# Patient Record
Sex: Male | Born: 1950
Health system: Southern US, Community
[De-identification: ages and names within clinical notes are randomized; demographics above are authoritative.]

## PROBLEM LIST (undated history)

## (undated) DIAGNOSIS — M1990 Unspecified osteoarthritis, unspecified site: Secondary | ICD-10-CM

## (undated) DIAGNOSIS — T7840XA Allergy, unspecified, initial encounter: Secondary | ICD-10-CM

## (undated) DIAGNOSIS — H269 Unspecified cataract: Secondary | ICD-10-CM

## (undated) DIAGNOSIS — K219 Gastro-esophageal reflux disease without esophagitis: Secondary | ICD-10-CM

## (undated) HISTORY — DX: Unspecified osteoarthritis, unspecified site: M19.90

## (undated) HISTORY — PX: COLONOSCOPY: SHX174

## (undated) HISTORY — DX: Unspecified cataract: H26.9

## (undated) HISTORY — PX: NERVE TRANSFER: SHX2084

## (undated) HISTORY — DX: Allergy, unspecified, initial encounter: T78.40XA

## (undated) HISTORY — PX: EYE SURGERY: SHX253

## (undated) HISTORY — PX: POLYPECTOMY: SHX149

## (undated) HISTORY — DX: Gastro-esophageal reflux disease without esophagitis: K21.9

---

## 2004-01-29 ENCOUNTER — Emergency Department (HOSPITAL_COMMUNITY): Admission: EM | Admit: 2004-01-29 | Discharge: 2004-01-29 | Payer: Self-pay | Admitting: Emergency Medicine

## 2004-04-14 ENCOUNTER — Ambulatory Visit: Payer: Self-pay | Admitting: Internal Medicine

## 2004-05-11 ENCOUNTER — Ambulatory Visit (HOSPITAL_BASED_OUTPATIENT_CLINIC_OR_DEPARTMENT_OTHER): Admission: RE | Admit: 2004-05-11 | Discharge: 2004-05-11 | Payer: Self-pay | Admitting: Orthopedic Surgery

## 2006-02-09 ENCOUNTER — Ambulatory Visit: Payer: Self-pay | Admitting: Internal Medicine

## 2006-02-09 LAB — CONVERTED CEMR LAB
ALT: 21 units/L (ref 0–40)
Albumin: 4.1 g/dL (ref 3.5–5.2)
Alkaline Phosphatase: 54 units/L (ref 39–117)
Basophils Relative: 0.6 % (ref 0.0–1.0)
CO2: 30 meq/L (ref 19–32)
Eosinophil percent: 4.2 % (ref 0.0–5.0)
Glucose, Bld: 93 mg/dL (ref 70–99)
HCT: 48.7 % (ref 39.0–52.0)
Hemoglobin: 16.9 g/dL (ref 13.0–17.0)
Hgb A1c MFr Bld: 4.8 % (ref 4.6–6.0)
MCHC: 34.8 g/dL (ref 30.0–36.0)
MCV: 92.9 fL (ref 78.0–100.0)
Monocytes Absolute: 0.6 10*3/uL (ref 0.2–0.7)
Neutro Abs: 4.4 10*3/uL (ref 1.4–7.7)
Neutrophils Relative %: 63.3 % (ref 43.0–77.0)
Sodium: 141 meq/L (ref 135–145)
Total Bilirubin: 1 mg/dL (ref 0.3–1.2)
Total Protein: 6.9 g/dL (ref 6.0–8.3)
WBC: 6.9 10*3/uL (ref 4.5–10.5)

## 2014-07-15 ENCOUNTER — Encounter: Payer: Self-pay | Admitting: Family

## 2014-07-15 ENCOUNTER — Other Ambulatory Visit (INDEPENDENT_AMBULATORY_CARE_PROVIDER_SITE_OTHER): Payer: 59

## 2014-07-15 ENCOUNTER — Ambulatory Visit (INDEPENDENT_AMBULATORY_CARE_PROVIDER_SITE_OTHER): Payer: 59 | Admitting: Family

## 2014-07-15 ENCOUNTER — Telehealth: Payer: Self-pay | Admitting: Family

## 2014-07-15 VITALS — BP 134/86 | HR 70 | Temp 97.7°F | Resp 16 | Ht 68.0 in | Wt 193.0 lb

## 2014-07-15 DIAGNOSIS — Z23 Encounter for immunization: Secondary | ICD-10-CM

## 2014-07-15 DIAGNOSIS — Z0000 Encounter for general adult medical examination without abnormal findings: Secondary | ICD-10-CM | POA: Diagnosis not present

## 2014-07-15 LAB — BASIC METABOLIC PANEL
BUN: 16 mg/dL (ref 6–23)
CHLORIDE: 110 meq/L (ref 96–112)
CO2: 28 meq/L (ref 19–32)
CREATININE: 0.83 mg/dL (ref 0.40–1.50)
Calcium: 9.5 mg/dL (ref 8.4–10.5)
GFR: 99.76 mL/min (ref >60.00)
Glucose, Bld: 94 mg/dL (ref 70–99)
Potassium: 4.7 mEq/L (ref 3.5–5.1)
Sodium: 141 mEq/L (ref 135–145)

## 2014-07-15 LAB — LIPID PANEL
CHOL/HDL RATIO: 3
CHOLESTEROL: 165 mg/dL (ref 0–200)
HDL: 48.2 mg/dL (ref >39.00)
LDL Cholesterol: 107 mg/dL — ABNORMAL HIGH (ref 0–99)
NonHDL: 116.8
TRIGLYCERIDES: 47 mg/dL (ref 0.0–149.0)
VLDL: 9.4 mg/dL (ref 0.0–40.0)

## 2014-07-15 LAB — CBC
HCT: 48.1 % (ref 39.0–52.0)
HEMOGLOBIN: 17 g/dL (ref 13.0–17.0)
MCHC: 35.3 g/dL (ref 30.0–36.0)
MCV: 93.1 fl (ref 78.0–100.0)
PLATELETS: 283 10*3/uL (ref 150.0–400.0)
RBC: 5.17 Mil/uL (ref 4.22–5.81)
RDW: 12.9 % (ref 11.5–15.5)
WBC: 6.7 10*3/uL (ref 4.0–10.5)

## 2014-07-15 LAB — HEMOGLOBIN A1C: Hgb A1c MFr Bld: 5 % (ref 4.6–6.5)

## 2014-07-15 LAB — TSH: TSH: 2.55 u[IU]/mL (ref 0.35–4.50)

## 2014-07-15 NOTE — Telephone Encounter (Signed)
Updated pharmacy.../lmb 

## 2014-07-15 NOTE — Assessment & Plan Note (Addendum)
1) Anticipatory Guidance: Discussed importance of wearing a seatbelt while driving and not texting while driving; changing batteries in smoke detector at least once annually; wearing suntan lotion when outside; eating a balanced and moderate diet; getting physical activity at least 30 minutes per day.  2) Immunizations / Screenings / Labs:  TDAP shot given today. Prescription for Zostavax given. Declined flu shot. All other immunizations are up to date per recommendations. Patient is due for a dental exam which he will schedule and colonoscopy referral has been placed. Obtain CBC, BMET, Lipid profile and A1c, TSH, Hep C.  Overall well exam. Patients BMI indicates that he is overweight. Recommend weight loss of 5-10% of his body weight through increased nutrient density and increased physical activity with goal of [address]00 steps. Otherwise continue healthy lifestyle behaviors. Follow up prevention exam in 1 year. Follow up office visit pending lab work.

## 2014-07-15 NOTE — Progress Notes (Signed)
Subjective:    Patient ID: Sergio Nelson, male    DOB: December 01, 1950, 64 y.o.   MRN: [id number]  Chief Complaint  Patient presents with  . Establish Care  . Annual Exam    HPI:  Sergio Nelson is a 64 y.o. male who presents today for an annual wellness visit.   1) Health Maintenance -   Diet - Averages 2-3 meals per day; Balance between fruits, vegetables, lean meats, whole grains; 3-4 cups of caffeine per day  Exercise - Not a lot; walks at work. Indicates he walks about 6000-7000 steps per day.   2) Preventative Exams / Immunizations:  Dental -- Due for an exam  Vision -- Up to date   Health Maintenance  Topic Date Due  . TETANUS/TDAP  06/26/1971  . COLONOSCOPY  06/26/2002  . ZOSTAVAX  06/25/2012  . INFLUENZA VACCINE  01/18/2015 (Originally 11/15/2013)  . HIV Screening  07/15/2015 (Originally 06/26/1967)      There is no immunization history on file for this patient.  No Known Allergies  No current outpatient prescriptions on file prior to visit.   No current facility-administered medications on file prior to visit.    Past Medical History  Diagnosis Date  . GERD (gastroesophageal reflux disease)   . Allergy     Past Surgical History  Procedure Laterality Date  . Nerve transfer      on left elbow    Family History  Problem Relation Age of Onset  . Hypertension Mother   . Diabetes Father   . Diabetes Paternal Aunt   . Diabetes Paternal Uncle   . Cancer Maternal Grandmother   . Diabetes Paternal Grandmother   . Cancer Paternal Grandmother   . Diabetes Paternal Grandfather     History   Social History  . Marital Status: Married    Spouse Name: N/A  . Number of Children: 2  . Years of Education: 16   Occupational History  . Engineer    Social History Main Topics  . Smoking status: Never Smoker   . Smokeless tobacco: Never Used  . Alcohol Use: 0.0 oz/week    0 Standard drinks or equivalent per week     Comment: occasional  . Drug Use: No    . Sexual Activity: Not on file   Other Topics Concern  . Not on file   Social History Narrative   Fun: Travel, work on cars, go to [employer], and play golf.    Denies any religious beliefs that effect health care.      Review of Systems  Constitutional: Denies fever, chills, fatigue, or significant weight gain/loss. HENT: Head: Denies headache or neck pain Ears: Denies changes in hearing, ringing in ears, earache, drainage Nose: Denies discharge, stuffiness, itching, nosebleed, sinus pain Allergies that are worse in the morning. Has had it for several years and has tried Zyrtec. Indicates that the symptoms wax and wane. Throat: Denies sore throat, hoarseness, dry mouth, sores, thrush Eyes: Denies loss/changes in vision, pain, redness, blurry/double vision, flashing lights Cardiovascular: Denies chest pain/discomfort, tightness, palpitations, shortness of breath with activity, difficulty lying down, swelling, sudden awakening with shortness of breath Respiratory: Denies shortness of breath, cough, sputum production, wheezing Gastrointestinal: Denies dysphasia, heartburn, change in appetite, nausea, change in bowel habits, rectal bleeding, constipation, diarrhea, yellow skin or eyes Genitourinary: Denies frequency, urgency, burning/pain, blood in urine, incontinence, change in urinary strength. Musculoskeletal: Denies muscle/joint pain, stiffness, back pain, redness or swelling of joints, trauma Skin:  Denies rashes, lumps, itching, dryness, color changes, or hair/nail changes Neurological: Denies dizziness, fainting, seizures, weakness, numbness, tingling, tremor Psychiatric - Denies nervousness, stress, depression or memory loss Endocrine: Denies heat or cold intolerance, sweating, frequent urination, excessive thirst, changes in appetite Hematologic: Denies ease of bruising or bleeding     Objective:     BP 134/86 mmHg  Pulse 70  Temp(Src) 97.7 F (36.5 C) (Oral)  Resp 16   Ht 5\' 8"  (1.727 m)  Wt 193 lb (87.544 kg)  BMI 29.35 kg/m2  SpO2 95% Nursing note and vital signs reviewed.  Physical Exam  Constitutional: He is oriented to person, place, and time. He appears well-developed and well-nourished.  HENT:  Head: Normocephalic.  Right Ear: Hearing, tympanic membrane, external ear and ear canal normal.  Left Ear: Hearing, tympanic membrane, external ear and ear canal normal.  Nose: Nose normal.  Mouth/Throat: Uvula is midline, oropharynx is clear and moist and mucous membranes are normal.  Turbinates noted to be slightly pale.   Eyes: Conjunctivae and EOM are normal. Pupils are equal, round, and reactive to light.  Neck: Neck supple. No JVD present. No tracheal deviation present. No thyromegaly present.  Cardiovascular: Normal rate, regular rhythm, normal heart sounds and intact distal pulses.   Pulmonary/Chest: Effort normal and breath sounds normal.  Abdominal: Soft. Bowel sounds are normal. He exhibits no distension and no mass. There is no tenderness. There is no rebound and no guarding.  Musculoskeletal: Normal range of motion. He exhibits no edema or tenderness.  Lymphadenopathy:    He has no cervical adenopathy.  Neurological: He is alert and oriented to person, place, and time. He has normal reflexes. No cranial nerve deficit. He exhibits normal muscle tone. Coordination normal.  Skin: Skin is warm and dry.  Psychiatric: He has a normal mood and affect. His behavior is normal. Judgment and thought content normal.       Assessment & Plan:

## 2014-07-15 NOTE — Patient Instructions (Signed)
Thank you for choosing [facility].  Summary/Instructions:  Health Maintenance A healthy lifestyle and preventative care can promote health and wellness.  Maintain regular health, dental, and eye exams.  Eat a healthy diet. Foods like vegetables, fruits, whole grains, low-fat dairy products, and lean protein foods contain the nutrients you need and are low in calories. Decrease your intake of foods high in solid fats, added sugars, and salt. Get information about a proper diet from your health care provider, if necessary.  Regular physical exercise is one of the most important things you can do for your health. Most adults should get at least 150 minutes of moderate-intensity exercise (any activity that increases your heart rate and causes you to sweat) each week. In addition, most adults need muscle-strengthening exercises on 2 or more days a week.   Maintain a healthy weight. The body mass index (BMI) is a screening tool to identify possible weight problems. It provides an estimate of body fat based on height and weight. Your health care provider can find your BMI and can help you achieve or maintain a healthy weight. For males 20 years and older:  A BMI below 18.5 is considered underweight.  A BMI of 18.5 to 24.9 is normal.  A BMI of 25 to 29.9 is considered overweight.  A BMI of 30 and above is considered obese.  Maintain normal blood lipids and cholesterol by exercising and minimizing your intake of saturated fat. Eat a balanced diet with plenty of fruits and vegetables. Blood tests for lipids and cholesterol should begin at age 13 and be repeated every 5 years. If your lipid or cholesterol levels are high, you are over age 24, or you are at high risk for heart disease, you may need your cholesterol levels checked more frequently.Ongoing high lipid and cholesterol levels should be treated with medicines if diet and exercise are not working.  If you smoke, find out from your  health care provider how to quit. If you do not use tobacco, do not start.  Lung cancer screening is recommended for adults aged 43-80 years who are at high risk for developing lung cancer because of a history of smoking. A yearly low-dose CT scan of the lungs is recommended for people who have at least a 30-pack-year history of smoking and are current smokers or have quit within the past 15 years. A pack year of smoking is smoking an average of 1 pack of cigarettes a day for 1 year (for example, a 30-pack-year history of smoking could mean smoking 1 pack a day for 30 years or 2 packs a day for 15 years). Yearly screening should continue until the smoker has stopped smoking for at least 15 years. Yearly screening should be stopped for people who develop a health problem that would prevent them from having lung cancer treatment.  If you choose to drink alcohol, do not have more than 2 drinks per day. One drink is considered to be 12 oz (360 mL) of beer, 5 oz (150 mL) of wine, or 1.5 oz (45 mL) of liquor.  Avoid the use of street drugs. Do not share needles with anyone. Ask for help if you need support or instructions about stopping the use of drugs.  High blood pressure causes heart disease and increases the risk of stroke. Blood pressure should be checked at least every 1-2 years. Ongoing high blood pressure should be treated with medicines if weight loss and exercise are not effective.  If you are  107-57 years old, ask your health care provider if you should take aspirin to prevent heart disease.  Diabetes screening involves taking a blood sample to check your fasting blood sugar level. This should be done once every 3 years after age 25 if you are at a normal weight and without risk factors for diabetes. Testing should be considered at a younger age or be carried out more frequently if you are overweight and have at least 1 risk factor for diabetes.  Colorectal cancer can be detected and often  prevented. Most routine colorectal cancer screening begins at the age of 97 and continues through age 42. However, your health care provider may recommend screening at an earlier age if you have risk factors for colon cancer. On a yearly basis, your health care provider may provide home test kits to check for hidden blood in the stool. A small camera at the end of a tube may be used to directly examine the colon (sigmoidoscopy or colonoscopy) to detect the earliest forms of colorectal cancer. Talk to your health care provider about this at age 62 when routine screening begins. A direct exam of the colon should be repeated every 5-10 years through age 40, unless early forms of precancerous polyps or small growths are found.  People who are at an increased risk for hepatitis B should be screened for this virus. You are considered at high risk for hepatitis B if:  You were born in a country where hepatitis B occurs often. Talk with your health care provider about which countries are considered high risk.  Your parents were born in a high-risk country and you have not received a shot to protect against hepatitis B (hepatitis B vaccine).  You have HIV or AIDS.  You use needles to inject street drugs.  You live with, or have sex with, someone who has hepatitis B.  You are a man who has sex with other men (MSM).  You get hemodialysis treatment.  You take certain medicines for conditions like cancer, organ transplantation, and autoimmune conditions.  Hepatitis C blood testing is recommended for all people born from [birth year] through 1963 and any individual with known risk factors for hepatitis C.  Healthy men should no longer receive prostate-specific antigen (PSA) blood tests as part of routine cancer screening. Talk to your health care provider about prostate cancer screening.  Testicular cancer screening is not recommended for adolescents or adult males who have no symptoms. Screening includes  self-exam, a health care provider exam, and other screening tests. Consult with your health care provider about any symptoms you have or any concerns you have about testicular cancer.  Practice safe sex. Use condoms and avoid high-risk sexual practices to reduce the spread of sexually transmitted infections (STIs).  You should be screened for STIs, including gonorrhea and chlamydia if:  You are sexually active and are younger than 24 years.  You are older than 24 years, and your health care provider tells you that you are at risk for this type of infection.  Your sexual activity has changed since you were last screened, and you are at an increased risk for chlamydia or gonorrhea. Ask your health care provider if you are at risk.  If you are at risk of being infected with HIV, it is recommended that you take a prescription medicine daily to prevent HIV infection. This is called pre-exposure prophylaxis (PrEP). You are considered at risk if:  You are a man who has sex with other  men (MSM).  You are a heterosexual man who is sexually active with multiple partners.  You take drugs by injection.  You are sexually active with a partner who has HIV.  Talk with your health care provider about whether you are at high risk of being infected with HIV. If you choose to begin PrEP, you should first be tested for HIV. You should then be tested every 3 months for as long as you are taking PrEP.  Use sunscreen. Apply sunscreen liberally and repeatedly throughout the day. You should seek shade when your shadow is shorter than you. Protect yourself by wearing long sleeves, pants, a wide-brimmed hat, and sunglasses year round whenever you are outdoors.  Tell your health care provider of new moles or changes in moles, especially if there is a change in shape or color. Also, tell your health care provider if a mole is larger than the size of a pencil eraser.  A one-time screening for abdominal aortic aneurysm  (AAA) and surgical repair of large AAAs by ultrasound is recommended for men aged 40-75 years who are current or former smokers.  Stay current with your vaccines (immunizations). Document Released: 09/30/2007 Document Revised: 04/08/2013 Document Reviewed: 08/29/2010 [facility] Patient Information 2015 [place], [place]. This information is not intended to replace advice given to you by your health care provider. Make sure you discuss any questions you have with your health care provider.

## 2014-07-15 NOTE — Progress Notes (Deleted)
   Subjective:    Patient ID: Sergio Nelson, male    DOB: [birth year], 64 y.o.   MRN: [id number]  No chief complaint on file.   HPI:  Sergio Nelson is a 64 y.o. male who presents today    Review of Systems    Objective:    BP 134/86 mmHg  Pulse 70  Temp(Src) 97.7 F (36.5 C) (Oral)  Resp 16  Ht 5\' 8"  (1.727 m)  Wt 193 lb (87.544 kg)  BMI 29.35 kg/m2  SpO2 95% Nursing note and vital signs reviewed.  Physical Exam  Constitutional: He is oriented to person, place, and time. He appears well-developed and well-nourished. No distress.  Cardiovascular: Normal rate, regular rhythm, normal heart sounds and intact distal pulses.   Pulmonary/Chest: Effort normal and breath sounds normal.  Neurological: He is alert and oriented to person, place, and time.  Skin: Skin is warm and dry.  Psychiatric: He has a normal mood and affect. His behavior is normal. Judgment and thought content normal.       Assessment & Plan:

## 2014-07-15 NOTE — Telephone Encounter (Signed)
Pt stopped back by front desk after his appt with Marya Amsler and wants to have his pharmacy location updated to [facility] [address].

## 2014-07-15 NOTE — Progress Notes (Signed)
Pre visit review using our clinic review tool, if applicable. No additional management support is needed unless otherwise documented below in the visit note. 

## 2014-07-18 ENCOUNTER — Telehealth: Payer: Self-pay | Admitting: Family

## 2014-07-18 NOTE — Telephone Encounter (Signed)
Please inform the patient that his kidney function, electrolytes, white/red blood cells, thyroid, and cholesterol are all normal. His A1c was 5.0 which is also normal indicating no diabetes or pre-diabetes. We await his hepatitis C test results at this time.

## 2014-07-20 LAB — HEPATITIS C RNA QUANTITATIVE: HCV QUANT: NOT DETECTED [IU]/mL (ref <15)

## 2014-07-20 NOTE — Telephone Encounter (Signed)
LVM for pt to call back.

## 2014-07-22 NOTE — Telephone Encounter (Signed)
Pt aware of results 

## 2014-07-22 NOTE — Telephone Encounter (Signed)
LVM for pt to call back.

## 2015-04-14 ENCOUNTER — Ambulatory Visit: Payer: 59 | Admitting: Family

## 2016-10-12 ENCOUNTER — Encounter: Payer: Self-pay | Admitting: Internal Medicine

## 2016-10-12 ENCOUNTER — Ambulatory Visit (INDEPENDENT_AMBULATORY_CARE_PROVIDER_SITE_OTHER): Payer: 59 | Admitting: Internal Medicine

## 2016-10-12 ENCOUNTER — Other Ambulatory Visit (INDEPENDENT_AMBULATORY_CARE_PROVIDER_SITE_OTHER): Payer: 59

## 2016-10-12 ENCOUNTER — Ambulatory Visit (INDEPENDENT_AMBULATORY_CARE_PROVIDER_SITE_OTHER)
Admission: RE | Admit: 2016-10-12 | Discharge: 2016-10-12 | Disposition: A | Discharge status: Home / Self Care | Payer: 59 | Source: Ambulatory Visit | Attending: Internal Medicine | Admitting: Internal Medicine

## 2016-10-12 VITALS — BP 150/82 | HR 77 | Ht 68.0 in | Wt 195.0 lb

## 2016-10-12 DIAGNOSIS — R030 Elevated blood-pressure reading, without diagnosis of hypertension: Secondary | ICD-10-CM

## 2016-10-12 DIAGNOSIS — E785 Hyperlipidemia, unspecified: Secondary | ICD-10-CM

## 2016-10-12 DIAGNOSIS — R079 Chest pain, unspecified: Secondary | ICD-10-CM | POA: Diagnosis not present

## 2016-10-12 DIAGNOSIS — R1011 Right upper quadrant pain: Secondary | ICD-10-CM

## 2016-10-12 DIAGNOSIS — R972 Elevated prostate specific antigen [PSA]: Secondary | ICD-10-CM

## 2016-10-12 LAB — LIPID PANEL
CHOLESTEROL: 168 mg/dL (ref 0–200)
HDL: 55.2 mg/dL (ref >39.00)
LDL Cholesterol: 104 mg/dL — ABNORMAL HIGH (ref 0–99)
NONHDL: 112.51
Total CHOL/HDL Ratio: 3
Triglycerides: 44 mg/dL (ref 0.0–149.0)
VLDL: 8.8 mg/dL (ref 0.0–40.0)

## 2016-10-12 LAB — URINALYSIS, ROUTINE W REFLEX MICROSCOPIC
Bilirubin Urine: NEGATIVE
KETONES UR: NEGATIVE
LEUKOCYTES UA: NEGATIVE
Nitrite: NEGATIVE
PH: 6 (ref 5.0–8.0)
Total Protein, Urine: NEGATIVE
UROBILINOGEN UA: 0.2 (ref 0.0–1.0)
Urine Glucose: NEGATIVE

## 2016-10-12 LAB — CBC WITH DIFFERENTIAL/PLATELET
BASOS ABS: 0 10*3/uL (ref 0.0–0.1)
Basophils Relative: 0.3 % (ref 0.0–3.0)
Eosinophils Absolute: 0 10*3/uL (ref 0.0–0.7)
Eosinophils Relative: 0.2 % (ref 0.0–5.0)
HCT: 48 % (ref 39.0–52.0)
Hemoglobin: 16.7 g/dL (ref 13.0–17.0)
LYMPHS ABS: 1.9 10*3/uL (ref 0.7–4.0)
Lymphocytes Relative: 13.8 % (ref 12.0–46.0)
MCHC: 34.9 g/dL (ref 30.0–36.0)
MCV: 93.8 fl (ref 78.0–100.0)
MONO ABS: 0.9 10*3/uL (ref 0.1–1.0)
Monocytes Relative: 6.6 % (ref 3.0–12.0)
NEUTROS ABS: 10.7 10*3/uL — AB (ref 1.4–7.7)
NEUTROS PCT: 79.1 % — AB (ref 43.0–77.0)
PLATELETS: 304 10*3/uL (ref 150.0–400.0)
RBC: 5.12 Mil/uL (ref 4.22–5.81)
RDW: 13.2 % (ref 11.5–15.5)
WBC: 13.5 10*3/uL — ABNORMAL HIGH (ref 4.0–10.5)

## 2016-10-12 LAB — HEPATIC FUNCTION PANEL
ALBUMIN: 4.2 g/dL (ref 3.5–5.2)
ALK PHOS: 47 U/L (ref 39–117)
ALT: 15 U/L (ref 0–53)
AST: 17 U/L (ref 0–37)
BILIRUBIN DIRECT: 0.2 mg/dL (ref 0.0–0.3)
TOTAL PROTEIN: 6.4 g/dL (ref 6.0–8.3)
Total Bilirubin: 0.9 mg/dL (ref 0.2–1.2)

## 2016-10-12 LAB — BASIC METABOLIC PANEL
BUN: 9 mg/dL (ref 6–23)
CHLORIDE: 109 meq/L (ref 96–112)
CO2: 26 meq/L (ref 19–32)
Calcium: 9.7 mg/dL (ref 8.4–10.5)
Creatinine, Ser: 0.77 mg/dL (ref 0.40–1.50)
GFR: 108 mL/min (ref >60.00)
GLUCOSE: 141 mg/dL — AB (ref 70–99)
POTASSIUM: 4.5 meq/L (ref 3.5–5.1)
Sodium: 143 mEq/L (ref 135–145)

## 2016-10-12 LAB — TSH: TSH: 2.03 u[IU]/mL (ref 0.35–4.50)

## 2016-10-12 LAB — PSA: PSA: 0.73 ng/mL (ref 0.10–4.00)

## 2016-10-12 MED ORDER — ASPIRIN 81 MG PO TABS
81.0000 mg | ORAL_TABLET | Freq: Every day | ORAL | 11 refills | Status: DC
Start: 2016-10-12 — End: 2017-10-22

## 2016-10-12 NOTE — Assessment & Plan Note (Signed)
Mild, for f/u lab per pt request, lower chol diet

## 2016-10-12 NOTE — Progress Notes (Signed)
Subjective:    Patient ID: Sergio Nelson, male    DOB: 1950-10-03, 66 y.o.   MRN: [id number]  HPI  Here with acute visit as PCP not available; c/o pain mostly to RUQ but points to radiating around both lower rib cage at the costal margins;  Has hx of reflux but no help with antacid; tylenol helped more but now worn off.  Pain is mild to mod, non exertional , non pleuritic, but was worse with change in position at night  did keep him up at night.  Had lower appetite, ? Clammy and nausea.  No sob, heart racing or dizziness.   BP at home usually < 140/90 Pt denies other chest pain, increased sob or doe, wheezing, orthopnea, PND, increased LE swelling, palpitations, dizziness or syncope.  Wife with him is insistent on "full panel" of labs though I am not PCP. BP Readings from Last 3 Encounters:  10/12/16 (!) 150/82  07/15/14 134/86  Retired in Feb 2108, lost 5 lbs but does not think too abnormal.  No fever, cough, wheezing.  No hx of liver/GB or PUD.  Denies worsening reflux, other abd pain, dysphagia,  bowel change or blood, though has mild intermittent constipation better with prunes for several years.   Past Medical History:  Diagnosis Date  . Allergy   . GERD (gastroesophageal reflux disease)    Past Surgical History:  Procedure Laterality Date  . NERVE TRANSFER     on left elbow    reports that he has never smoked. He has never used smokeless tobacco. He reports that he drinks alcohol. He reports that he does not use drugs. family history includes Cancer in his maternal grandmother and paternal grandmother; Diabetes in his father, paternal aunt, paternal grandfather, paternal grandmother, and paternal uncle; Hypertension in his mother. No Known Allergies No current outpatient prescriptions on file prior to visit.   No current facility-administered medications on file prior to visit.    Review of Systems  Constitutional: Negative for other unusual diaphoresis or sweats HENT: Negative for  ear discharge or swelling Eyes: Negative for other worsening visual disturbances Respiratory: Negative for stridor or other swelling  Gastrointestinal: Negative for worsening distension or other blood Genitourinary: Negative for retention or other urinary change Musculoskeletal: Negative for other MSK pain or swelling Skin: Negative for color change or other new lesions Neurological: Negative for worsening tremors and other numbness  Psychiatric/Behavioral: Negative for worsening agitation or other fatigue All other system neg per pt    Objective:   Physical Exam BP (!) 150/82   Pulse 77   Ht 5\' 8"  (1.727 m)   Wt 195 lb (88.5 kg)   SpO2 100%   BMI 29.65 kg/m  VS noted,  Constitutional: Pt appears in NAD HENT: Head: NCAT.  Right Ear: External ear normal.  Left Ear: External ear normal.  Eyes: . Pupils are equal, round, and reactive to light. Conjunctivae and EOM are normal Nose: without d/c or deformity Neck: Neck supple. Gross normal ROM Cardiovascular: Normal rate and regular rhythm.   Pulmonary/Chest: Effort normal and breath sounds without rales or wheezing.  Abd:  Soft, NT, ND, + BS, no organomegaly except for bilat costal margin tenderness without swelling or rash Neurological: Pt is alert. At baseline orientation, motor grossly intact Skin: Skin is warm. No rashes, other new lesions, no LE edema Psychiatric: Pt behavior is normal without agitation  No other exam findings  Lab Results  Component Value Date   WBC  6.7 07/15/2014   HGB 17.0 07/15/2014   HCT 48.1 07/15/2014   PLT 283.0 07/15/2014   GLUCOSE 94 07/15/2014   CHOL 165 07/15/2014   TRIG 47.0 07/15/2014   HDL 48.20 07/15/2014   LDLCALC 107 (H) 07/15/2014   ALT 21 02/09/2006   AST 23 02/09/2006   NA 141 07/15/2014   K 4.7 07/15/2014   CL 110 07/15/2014   CREATININE 0.83 07/15/2014   BUN 16 07/15/2014   CO2 28 07/15/2014   TSH 2.55 07/15/2014   PSA 0.54 02/09/2006   HGBA1C 5.0 07/15/2014   ECG  today I have personally interpreted NSR  Hemoglobin A1c  Order: [id number]  Status:  Final result  Visible to patient:  No (Not Released)  Dx:  Hyperlipoproteinemia    Ref Range & Units 15:02 21yr ago   Hgb A1c MFr Bld 4.6 - 6.5 % 4.9  5.0CM            Assessment & Plan:

## 2016-10-12 NOTE — Patient Instructions (Addendum)
Your EKG was OK today  OK to take tylenol further for pain  Please start Aspirin 81 mg - 1 per day (enteric coated only) - OTC  Please continue all other medications as before, and refills have been done if requested.  Please have the pharmacy call with any other refills you may need.  Please continue your efforts at being more active, low cholesterol diet, and weight control.  Please keep your appointments with your specialists as you may have planned  You will be contacted regarding the referral for: Stress Test  Please go to the XRAY Department in the Basement (go straight as you get off the elevator) for the x-ray testing  Please go to the LAB in the Basement (turn left off the elevator) for the tests to be done today  You will be contacted by phone if any changes need to be made immediately.  Otherwise, you will receive a letter about your results with an explanation, but please check with MyChart first.  Please remember to sign up for MyChart if you have not done so, as this will be important to you in the future with finding out test results, communicating by private email, and scheduling acute appointments online when needed.

## 2016-10-13 ENCOUNTER — Encounter: Payer: Self-pay | Admitting: Internal Medicine

## 2016-10-13 ENCOUNTER — Other Ambulatory Visit (INDEPENDENT_AMBULATORY_CARE_PROVIDER_SITE_OTHER): Payer: 59

## 2016-10-13 DIAGNOSIS — E785 Hyperlipidemia, unspecified: Secondary | ICD-10-CM

## 2016-10-13 DIAGNOSIS — R079 Chest pain, unspecified: Secondary | ICD-10-CM

## 2016-10-13 LAB — HEMOGLOBIN A1C: HEMOGLOBIN A1C: 4.9 % (ref 4.6–6.5)

## 2016-10-15 ENCOUNTER — Emergency Department (HOSPITAL_COMMUNITY): Payer: 59

## 2016-10-15 ENCOUNTER — Observation Stay (HOSPITAL_COMMUNITY)
Admission: EM | Admit: 2016-10-15 | Discharge: 2016-10-18 | Disposition: A | Discharge status: Home / Self Care | Payer: 59 | Attending: General Surgery | Admitting: General Surgery

## 2016-10-15 ENCOUNTER — Encounter (HOSPITAL_COMMUNITY): Payer: Self-pay | Admitting: Emergency Medicine

## 2016-10-15 DIAGNOSIS — K219 Gastro-esophageal reflux disease without esophagitis: Secondary | ICD-10-CM | POA: Diagnosis not present

## 2016-10-15 DIAGNOSIS — K8000 Calculus of gallbladder with acute cholecystitis without obstruction: Secondary | ICD-10-CM | POA: Diagnosis not present

## 2016-10-15 DIAGNOSIS — R109 Unspecified abdominal pain: Secondary | ICD-10-CM | POA: Diagnosis present

## 2016-10-15 DIAGNOSIS — R030 Elevated blood-pressure reading, without diagnosis of hypertension: Secondary | ICD-10-CM | POA: Insufficient documentation

## 2016-10-15 DIAGNOSIS — R1011 Right upper quadrant pain: Secondary | ICD-10-CM

## 2016-10-15 DIAGNOSIS — K819 Cholecystitis, unspecified: Secondary | ICD-10-CM | POA: Diagnosis present

## 2016-10-15 DIAGNOSIS — Z7982 Long term (current) use of aspirin: Secondary | ICD-10-CM | POA: Insufficient documentation

## 2016-10-15 DIAGNOSIS — K810 Acute cholecystitis: Secondary | ICD-10-CM

## 2016-10-15 DIAGNOSIS — Z4659 Encounter for fitting and adjustment of other gastrointestinal appliance and device: Secondary | ICD-10-CM

## 2016-10-15 DIAGNOSIS — Z419 Encounter for procedure for purposes other than remedying health state, unspecified: Secondary | ICD-10-CM

## 2016-10-15 DIAGNOSIS — K56609 Unspecified intestinal obstruction, unspecified as to partial versus complete obstruction: Secondary | ICD-10-CM

## 2016-10-15 LAB — LIPASE, BLOOD: LIPASE: 20 U/L (ref 11–51)

## 2016-10-15 LAB — CBC
HEMATOCRIT: 45.5 % (ref 39.0–52.0)
Hemoglobin: 16.4 g/dL (ref 13.0–17.0)
MCH: 33.4 pg (ref 26.0–34.0)
MCHC: 36 g/dL (ref 30.0–36.0)
MCV: 92.7 fL (ref 78.0–100.0)
Platelets: 287 10*3/uL (ref 150–400)
RBC: 4.91 MIL/uL (ref 4.22–5.81)
RDW: 12.9 % (ref 11.5–15.5)
WBC: 14.5 10*3/uL — AB (ref 4.0–10.5)

## 2016-10-15 LAB — COMPREHENSIVE METABOLIC PANEL
ALT: 17 U/L (ref 17–63)
AST: 22 U/L (ref 15–41)
Albumin: 3.7 g/dL (ref 3.5–5.0)
Alkaline Phosphatase: 49 U/L (ref 38–126)
Anion gap: 10 (ref 5–15)
BUN: 13 mg/dL (ref 6–20)
CHLORIDE: 104 mmol/L (ref 101–111)
CO2: 27 mmol/L (ref 22–32)
Calcium: 8.9 mg/dL (ref 8.9–10.3)
Creatinine, Ser: 0.81 mg/dL (ref 0.61–1.24)
Glucose, Bld: 90 mg/dL (ref 65–99)
POTASSIUM: 3.9 mmol/L (ref 3.5–5.1)
Sodium: 141 mmol/L (ref 135–145)
Total Bilirubin: 1.1 mg/dL (ref 0.3–1.2)
Total Protein: 7.5 g/dL (ref 6.5–8.1)

## 2016-10-15 LAB — URINALYSIS, ROUTINE W REFLEX MICROSCOPIC
BACTERIA UA: NONE SEEN
BILIRUBIN URINE: NEGATIVE
Glucose, UA: NEGATIVE mg/dL
Hgb urine dipstick: NEGATIVE
Ketones, ur: NEGATIVE mg/dL
Nitrite: NEGATIVE
Protein, ur: 30 mg/dL — AB
Specific Gravity, Urine: 1.038 — ABNORMAL HIGH (ref 1.005–1.030)
pH: 5 (ref 5.0–8.0)

## 2016-10-15 MED ORDER — CEFTRIAXONE SODIUM 2 G IJ SOLR
2.0000 g | Freq: Every day | INTRAMUSCULAR | Status: DC
  Administered 2016-10-16 – 2016-10-17 (×3): 2 g via INTRAVENOUS
  Filled 2016-10-15 (×3): qty 2

## 2016-10-15 MED ORDER — KCL IN DEXTROSE-NACL 20-5-0.45 MEQ/L-%-% IV SOLN
INTRAVENOUS | Status: DC
  Administered 2016-10-16: via INTRAVENOUS
  Administered 2016-10-16 – 2016-10-18 (×2): 1000 mL via INTRAVENOUS
  Filled 2016-10-15 (×8): qty 1000

## 2016-10-15 MED ORDER — SODIUM CHLORIDE 0.9 % IV BOLUS (SEPSIS)
500.0000 mL | Freq: Once | INTRAVENOUS | Status: AC
  Administered 2016-10-15: 500 mL via INTRAVENOUS

## 2016-10-15 MED ORDER — MORPHINE SULFATE (PF) 2 MG/ML IV SOLN
4.0000 mg | INTRAVENOUS | Status: DC | PRN
  Administered 2016-10-15: 4 mg via INTRAVENOUS
  Filled 2016-10-15 (×2): qty 2

## 2016-10-15 MED ORDER — PANTOPRAZOLE SODIUM 40 MG IV SOLR
40.0000 mg | Freq: Every day | INTRAVENOUS | Status: DC
  Administered 2016-10-15 – 2016-10-17 (×3): 40 mg via INTRAVENOUS
  Filled 2016-10-15 (×3): qty 40

## 2016-10-15 MED ORDER — HEPARIN SODIUM (PORCINE) 5000 UNIT/ML IJ SOLN
5000.0000 [IU] | Freq: Three times a day (TID) | INTRAMUSCULAR | Status: DC
  Administered 2016-10-15 – 2016-10-16 (×2): 5000 [IU] via SUBCUTANEOUS
  Filled 2016-10-15 (×2): qty 1

## 2016-10-15 MED ORDER — MORPHINE SULFATE (PF) 2 MG/ML IV SOLN
1.0000 mg | INTRAVENOUS | Status: DC | PRN
  Administered 2016-10-15 – 2016-10-16 (×3): 1 mg via INTRAVENOUS
  Filled 2016-10-15 (×2): qty 1

## 2016-10-15 MED ORDER — ONDANSETRON HCL 4 MG/2ML IJ SOLN
4.0000 mg | Freq: Four times a day (QID) | INTRAMUSCULAR | Status: DC | PRN
  Administered 2016-10-16 – 2016-10-17 (×2): 4 mg via INTRAVENOUS
  Filled 2016-10-15 (×2): qty 2

## 2016-10-15 MED ORDER — ONDANSETRON HCL 4 MG/2ML IJ SOLN
4.0000 mg | Freq: Once | INTRAMUSCULAR | Status: AC
  Administered 2016-10-15: 4 mg via INTRAVENOUS
  Filled 2016-10-15: qty 2

## 2016-10-15 MED ORDER — ONDANSETRON 4 MG PO TBDP: 4.0000 mg | ORAL_TABLET | Freq: Four times a day (QID) | ORAL | Status: DC | PRN

## 2016-10-15 MED ORDER — SODIUM CHLORIDE 0.9 % IV SOLN
Freq: Once | INTRAVENOUS | Status: AC
  Administered 2016-10-15: 19:00:00 via INTRAVENOUS

## 2016-10-15 NOTE — H&P (Signed)
Chief Complaint:  Right upper quadrant pain and gallstones  History of Present Illness:  Sergio Nelson is an 66 y.o. male who presented to the ER with a couple of day history of right upper quadrant pain that has been waxing and waning.  Ultrasound tonight showed wall thickening without fluid.  Focal tenderness without rebound.  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    Past Medical History:  Diagnosis Date  . Allergy   . GERD (gastroesophageal reflux disease)     Past Surgical History:  Procedure Laterality Date  . NERVE TRANSFER     on left elbow    No current facility-administered medications for this encounter.    Current Outpatient Prescriptions  Medication Sig Dispense Refill  . acetaminophen (TYLENOL) 325 MG tablet Take 325 mg by mouth every 6 (six) hours as needed for moderate pain.    . calcium carbonate (TUMS - DOSED IN MG ELEMENTAL CALCIUM) 500 MG chewable tablet Chew 1 tablet by mouth 2 (two) times daily as needed for indigestion or heartburn.    . fexofenadine-pseudoephedrine (ALLEGRA-D) 60-120 MG 12 hr tablet Take 1 tablet by mouth 2 (two) times daily as needed (allergies).    . Omega-3 Fatty Acids (FISH OIL) 1000 MG CAPS Take 1 capsule by mouth daily.    . ranitidine (ZANTAC) 150 MG tablet Take 150 mg by mouth 2 (two) times daily as needed for heartburn.    Marland Kitchen aspirin 81 MG tablet Take 1 tablet (81 mg total) by mouth daily. 100 tablet 11   Patient has no known allergies. Family History  Problem Relation Age of Onset  . Hypertension Mother   . Diabetes Father   . Diabetes Paternal Aunt   . Diabetes Paternal Uncle   . Cancer Maternal Grandmother   . Diabetes Paternal Grandmother   . Cancer Paternal Grandmother   . Diabetes Paternal Grandfather    Social History:   reports that he has never smoked. He has never used smokeless tobacco.  He reports that he drinks alcohol. He reports that he does not use drugs.   REVIEW OF SYSTEMS : Negative except for see problem list  Physical Exam:   Blood pressure 131/70, pulse 78, temperature 98.1 F (36.7 C), temperature source Oral, resp. rate 16, height 5' 8"  (1.727 m), weight 88.5 kg (195 lb), SpO2 100 %. Body mass index is 29.65 kg/m.  Gen:  WDWN WM NAD  Neurological: Alert and oriented to person, place, and time. Motor and sensory function is grossly intact  Head: Normocephalic and atraumatic.  Eyes: Conjunctivae are normal. Pupils are equal, round, and reactive to light. No scleral icterus.  Neck: Normal range of motion. Neck supple. No tracheal deviation or thyromegaly present.  Cardiovascular:  SR without murmurs or gallops.  No carotid bruits Breast:  Not examined Respiratory: Effort normal.  No respiratory distress. No chest wall tenderness. Breath sounds normal.  No wheezes, rales or rhonchi.  Abdomen:  Focal tenderness without rebound or guarding GU:  Not examined Musculoskeletal: Normal range of motion. Extremities are nontender. No cyanosis, edema or clubbing noted Lymphadenopathy: No cervical, preauricular, postauricular or axillary adenopathy is present Skin: Skin is warm and dry. No rash noted. No diaphoresis. No erythema. No pallor. Pscyh: Normal mood and affect. Behavior is normal. Judgment and thought content normal.   LABORATORY  RESULTS: Results for orders placed or performed during the hospital encounter of 10/15/16 (from the past 48 hour(s))  Lipase, blood     Status: None   Collection Time: 10/15/16  3:16 PM  Result Value Ref Range   Lipase 20 11 - 51 U/L  Comprehensive metabolic panel     Status: None   Collection Time: 10/15/16  3:16 PM  Result Value Ref Range   Sodium 141 135 - 145 mmol/L   Potassium 3.9 3.5 - 5.1 mmol/L   Chloride 104 101 - 111 mmol/L   CO2 27 22 - 32 mmol/L   Glucose, Bld 90 65 - 99 mg/dL   BUN 13 6 - 20 mg/dL   Creatinine,  Ser 0.81 0.61 - 1.24 mg/dL   Calcium 8.9 8.9 - 10.3 mg/dL   Total Protein 7.5 6.5 - 8.1 g/dL   Albumin 3.7 3.5 - 5.0 g/dL   AST 22 15 - 41 U/L   ALT 17 17 - 63 U/L   Alkaline Phosphatase 49 38 - 126 U/L   Total Bilirubin 1.1 0.3 - 1.2 mg/dL   GFR calc non Af Amer >60 >60 mL/min   GFR calc Af Amer >60 >60 mL/min    Comment: (NOTE) The eGFR has been calculated using the CKD EPI equation. This calculation has not been validated in all clinical situations. eGFR's persistently <60 mL/min signify possible Chronic Kidney Disease.    Anion gap 10 5 - 15  CBC     Status: Abnormal   Collection Time: 10/15/16  3:16 PM  Result Value Ref Range   WBC 14.5 (H) 4.0 - 10.5 K/uL   RBC 4.91 4.22 - 5.81 MIL/uL   Hemoglobin 16.4 13.0 - 17.0 g/dL   HCT 45.5 39.0 - 52.0 %   MCV 92.7 78.0 - 100.0 fL   MCH 33.4 26.0 - 34.0 pg   MCHC 36.0 30.0 - 36.0 g/dL   RDW 12.9 11.5 - 15.5 %   Platelets 287 150 - 400 K/uL  Urinalysis, Routine w reflex microscopic     Status: Abnormal   Collection Time: 10/15/16  3:16 PM  Result Value Ref Range   Color, Urine AMBER (A) YELLOW    Comment: BIOCHEMICALS MAY BE AFFECTED BY COLOR   APPearance HAZY (A) CLEAR   Specific Gravity, Urine 1.038 (H) 1.005 - 1.030   pH 5.0 5.0 - 8.0   Glucose, UA NEGATIVE NEGATIVE mg/dL   Hgb urine dipstick NEGATIVE NEGATIVE   Bilirubin Urine NEGATIVE NEGATIVE   Ketones, ur NEGATIVE NEGATIVE mg/dL   Protein, ur 30 (A) NEGATIVE mg/dL   Nitrite NEGATIVE NEGATIVE   Leukocytes, UA TRACE (A) NEGATIVE   RBC / HPF 0-5 0 - 5 RBC/hpf   WBC, UA 6-30 0 - 5 WBC/hpf   Bacteria, UA NONE SEEN NONE SEEN   Squamous Epithelial / LPF 0-5 (A) NONE SEEN   Mucous PRESENT      RADIOLOGY RESULTS: US Abdomen Limited Ruq  Result Date: 10/15/2016 CLINICAL DATA:  Right upper quadrant pain. EXAM: ULTRASOUND ABDOMEN LIMITED RIGHT UPPER QUADRANT COMPARISON:  None. FINDINGS: Gallbladder: Stones and sludge are seen in the gallbladder with no Murphy's sign. Mild  wall thickening is seen. No pericholecystic fluid. Common bile duct: Diameter: 4.4 mm Liver: No focal lesion identified. Within normal limits in parenchymal echogenicity. IMPRESSION: 1. Stones and sludge are seen in the gallbladder with mild wall thickening but no pericholecystic fluid or Murphy's sign. A HIDA scan may further evaluate if there is  concern for acute cholecystitis. Electronically Signed   By: Dorise Bullion III M.D   On: 10/15/2016 20:21    Problem List: Patient Active Problem List   Diagnosis Date Noted  . Elevated blood pressure reading without diagnosis of hypertension 10/15/2016  . RUQ pain 10/12/2016  . Chest pain 10/12/2016  . Hyperlipidemia 10/12/2016  . Routine general medical examination at a health care facility 07/15/2014    Assessment & Plan: Cholecystitis Admit, antibiotics and lap  chole    Matt B. Hassell Done, MD, [facility] [phone] beeper [phone]  10/15/2016 9:53 PM

## 2016-10-15 NOTE — Assessment & Plan Note (Addendum)
Atypical, cant r/o cardiac, ecg reviewed, for cxr, to start asa 81 qd, for stress test,  to f/u any worsening symptoms or concerns

## 2016-10-15 NOTE — ED Provider Notes (Signed)
[facility] DEPT Provider Note   CSN: [id number] Arrival date & time: 10/15/16  1451     History   Chief Complaint Chief Complaint  Patient presents with  . Abdominal Pain  . Nausea    HPI Sergio Nelson is a 66 y.o. male. Chief complaint is abdominal pain.  HPI:  49 over otherwise very healthy. He reports abdominal pain since Tuesday, 6 days ago. He ate some sausage. Later that night he had some crampy abdominal pain. Probably right upper quadrant. He has had continued episodes of intermittent pain nausea and emesis.  He's been able to eat small amounts of dry cereal and other very bland intake although frequently nauseated. Pain still right upper quadrant radiating to his back.  Had LFTs performed with his primary care physician on Thursday, 3 days ago which were normal.  Does not smoke. No alcohol use. No anti-inflammatory use. No recent steroids. Denies excessive caffeine.  Past surgical history negative  Past Medical History:  Diagnosis Date  . Allergy   . GERD (gastroesophageal reflux disease)     Patient Active Problem List   Diagnosis Date Noted  . Elevated blood pressure reading without diagnosis of hypertension 10/15/2016  . Cholecystitis 10/15/2016  . RUQ pain 10/12/2016  . Chest pain 10/12/2016  . Hyperlipidemia 10/12/2016  . Routine general medical examination at a health care facility 07/15/2014    Past Surgical History:  Procedure Laterality Date  . NERVE TRANSFER     on left elbow       Home Medications    Prior to Admission medications   Medication Sig Start Date End Date Taking? Authorizing Provider  acetaminophen (TYLENOL) 325 MG tablet Take 325 mg by mouth every 6 (six) hours as needed for moderate pain.   Yes [provider]  calcium carbonate (TUMS - DOSED IN MG ELEMENTAL CALCIUM) 500 MG chewable tablet Chew 1 tablet by mouth 2 (two) times daily as needed for indigestion or heartburn.   Yes [provider]    fexofenadine-pseudoephedrine (ALLEGRA-D) 60-120 MG 12 hr tablet Take 1 tablet by mouth 2 (two) times daily as needed (allergies).   Yes [provider]  Omega-3 Fatty Acids (FISH OIL) 1000 MG CAPS Take 1 capsule by mouth daily.   Yes [provider]  ranitidine (ZANTAC) 150 MG tablet Take 150 mg by mouth 2 (two) times daily as needed for heartburn.   Yes [provider]  aspirin 81 MG tablet Take 1 tablet (81 mg total) by mouth daily. 10/12/16   Biagio Borg, MD    Family History Family History  Problem Relation Age of Onset  . Hypertension Mother   . Diabetes Father   . Diabetes Paternal Aunt   . Diabetes Paternal Uncle   . Cancer Maternal Grandmother   . Diabetes Paternal Grandmother   . Cancer Paternal Grandmother   . Diabetes Paternal Grandfather     Social History Social History  Substance Use Topics  . Smoking status: Never Smoker  . Smokeless tobacco: Never Used  . Alcohol use 0.0 oz/week     Comment: occasional     Allergies   Patient has no known allergies.   Review of Systems Review of Systems  Constitutional: Negative for appetite change, chills, diaphoresis, fatigue and fever.  HENT: Negative for mouth sores, sore throat and trouble swallowing.   Eyes: Negative for visual disturbance.  Respiratory: Negative for cough, chest tightness, shortness of breath and wheezing.   Cardiovascular: Negative for chest  pain.  Gastrointestinal: Positive for abdominal pain, nausea and vomiting. Negative for abdominal distention and diarrhea.  Endocrine: Negative for polydipsia, polyphagia and polyuria.  Genitourinary: Negative for dysuria, frequency and hematuria.  Musculoskeletal: Negative for gait problem.  Skin: Negative for color change, pallor and rash.  Neurological: Negative for dizziness, syncope, light-headedness and headaches.  Hematological: Does not bruise/bleed easily.  Psychiatric/Behavioral: Negative for behavioral problems and  confusion.     Physical Exam Updated Vital Signs BP (!) 143/66 (BP Location: Left Arm)   Pulse 73   Temp (!) 101.2 F (38.4 C) (Oral)   Resp 18   Ht 5\' 8"  (1.727 m)   Wt 88.5 kg (195 lb)   SpO2 97%   BMI 29.65 kg/m   Physical Exam  Constitutional: He is oriented to person, place, and time. He appears well-developed and well-nourished. No distress.  HENT:  Head: Normocephalic.  Eyes: Conjunctivae are normal. Pupils are equal, round, and reactive to light. No scleral icterus.  Neck: Normal range of motion. Neck supple. No thyromegaly present.  Cardiovascular: Normal rate and regular rhythm.  Exam reveals no gallop and no friction rub.   No murmur heard. Pulmonary/Chest: Effort normal and breath sounds normal. No respiratory distress. He has no wheezes. He has no rales.  Abdominal: Soft. Bowel sounds are normal. He exhibits no distension. There is tenderness. There is no rebound.  Right upper quadrant subcostal tenderness.  Musculoskeletal: Normal range of motion.  Neurological: He is alert and oriented to person, place, and time.  Skin: Skin is warm and dry. No rash noted.  Psychiatric: He has a normal mood and affect. His behavior is normal.     ED Treatments / Results  Labs (all labs ordered are listed, but only abnormal results are displayed) Labs Reviewed  CBC - Abnormal; Notable for the following:       Result Value   WBC 14.5 (*)    All other components within normal limits  URINALYSIS, ROUTINE W REFLEX MICROSCOPIC - Abnormal; Notable for the following:    Color, Urine AMBER (*)    APPearance HAZY (*)    Specific Gravity, Urine 1.038 (*)    Protein, ur 30 (*)    Leukocytes, UA TRACE (*)    Squamous Epithelial / LPF 0-5 (*)    All other components within normal limits  LIPASE, BLOOD  COMPREHENSIVE METABOLIC PANEL  HIV ANTIBODY (ROUTINE TESTING)  CBC  CREATININE, SERUM    EKG  EKG Interpretation None       Radiology US Abdomen Limited Ruq  Result  Date: 10/15/2016 CLINICAL DATA:  Right upper quadrant pain. EXAM: ULTRASOUND ABDOMEN LIMITED RIGHT UPPER QUADRANT COMPARISON:  None. FINDINGS: Gallbladder: Stones and sludge are seen in the gallbladder with no Murphy's sign. Mild wall thickening is seen. No pericholecystic fluid. Common bile duct: Diameter: 4.4 mm Liver: No focal lesion identified. Within normal limits in parenchymal echogenicity. IMPRESSION: 1. Stones and sludge are seen in the gallbladder with mild wall thickening but no pericholecystic fluid or Murphy's sign. A HIDA scan may further evaluate if there is concern for acute cholecystitis. Electronically Signed   By: Dorise Bullion III M.D   On: 10/15/2016 20:21    Procedures Procedures (including critical care time)  Medications Ordered in ED Medications  heparin injection 5,000 Units (not administered)  dextrose 5 % and 0.45 % NaCl with KCl 20 mEq/L infusion (not administered)  cefTRIAXone (ROCEPHIN) 2 g in dextrose 5 % 50 mL IVPB (not administered)  morphine 2 MG/ML injection 1 mg (not administered)  ondansetron (ZOFRAN-ODT) disintegrating tablet 4 mg (not administered)    Or  ondansetron (ZOFRAN) injection 4 mg (not administered)  pantoprazole (PROTONIX) injection 40 mg (not administered)  morphine 2 MG/ML injection 4 mg (4 mg Intravenous Given 10/15/16 2227)  sodium chloride 0.9 % bolus 500 mL (0 mLs Intravenous Stopped 10/15/16 2157)  0.9 %  sodium chloride infusion ( Intravenous Stopped 10/15/16 2157)  ondansetron (ZOFRAN) injection 4 mg (4 mg Intravenous Given 10/15/16 2227)     Initial Impression / Assessment and Plan / ED Course  I have reviewed the triage vital signs and the nursing notes.  Pertinent labs & imaging results that were available during my care of the patient were reviewed by me and considered in my medical decision making (see chart for details).    Leukocytosis. LFTs normal. Ultrasound shows stones sludge and thickened wall.  I discussed the case with  Dr. Hassell Done. As always, he returned my call immediately. He presents to the emergency room, has examined the patient and interviewed him. He is planning admission for antibiotics and am cholecystectomy.  Final Clinical Impressions(s) / ED Diagnoses   Final diagnoses:  RUQ pain  Cholecystitis, acute    New Prescriptions Current Discharge Medication List       Tanna Furry, MD 10/15/16 2334

## 2016-10-15 NOTE — Assessment & Plan Note (Signed)
?   Reactive, no hx of essential HTN, declines further tx at this time, to f/u BP at home and next visit

## 2016-10-15 NOTE — ED Triage Notes (Signed)
Patient c/o right abd pain since Tuesday after eating a spicy dinner but wasn't relieved after taking antacids. Martin Majestic to PCP on WED and MD thought was muscular. Patient reports after leaving MD office he ate milkshake which increased pain and vomited.  Patient states that pain is constant but does increase in severity at times. Patient states that BMs have been normal and last one yesterday. Patient states that pain. Patient does decrease when taking Tylenol. Patient reports intermittent fevers.

## 2016-10-15 NOTE — Assessment & Plan Note (Signed)
Also for labs as documented, for abd u/s if abnormal lft's

## 2016-10-16 LAB — SURGICAL PCR SCREEN
MRSA, PCR: NEGATIVE
Staphylococcus aureus: POSITIVE — AB

## 2016-10-16 LAB — HIV ANTIBODY (ROUTINE TESTING W REFLEX): HIV SCREEN 4TH GENERATION: NONREACTIVE

## 2016-10-16 MED ORDER — CHLORHEXIDINE GLUCONATE CLOTH 2 % EX PADS
6.0000 | MEDICATED_PAD | Freq: Once | CUTANEOUS | Status: AC
  Administered 2016-10-17: 6 via TOPICAL

## 2016-10-16 MED ORDER — CHLORHEXIDINE GLUCONATE CLOTH 2 % EX PADS
6.0000 | MEDICATED_PAD | Freq: Once | CUTANEOUS | Status: AC
  Administered 2016-10-16: 6 via TOPICAL

## 2016-10-16 MED ORDER — HEPARIN SODIUM (PORCINE) 5000 UNIT/ML IJ SOLN
5000.0000 [IU] | Freq: Three times a day (TID) | INTRAMUSCULAR | Status: AC
  Administered 2016-10-16 (×2): 5000 [IU] via SUBCUTANEOUS
  Filled 2016-10-16: qty 1

## 2016-10-16 NOTE — Progress Notes (Signed)
Patient ID: Sergio Nelson, male   DOB: September 17, 1950, 66 y.o.   MRN: [id number]  [facility] Progress Note     Subjective: CC- cholecystitis Lying in bed comfortably. States that pain is currently well controlled with medication. Denies n/v.  Planning to go to [place] on Friday to visit family.  Objective: Vital signs in last 24 hours: Temp:  [98.1 F (36.7 C)-101.2 F (38.4 C)] 98.6 F (37 C) (07/02 0428) Pulse Rate:  [71-78] 71 (07/02 0428) Resp:  [16-18] 16 (07/02 0428) BP: (100-156)/(51-86) 100/51 (07/02 0428) SpO2:  [96 %-100 %] 98 % (07/02 0428) Weight:  [88.5 kg (195 lb)] 88.5 kg (195 lb) (07/01 1514)    Intake/Output from previous day: 07/01 0701 - 07/02 0700 In: 2131.7 [I.V.:1581.7; IV Piggyback:550] Out: 150 [Urine:150] Intake/Output this shift: No intake/output data recorded.  PE: Gen:  Alert, NAD, pleasant HEENT: EOM's intact, pupils equal  Card:  RRR, no M/G/R heard Pulm:  CTAB, no W/R/R, effort normal Abd: Soft, ND, mild RUQ tenderness, +BS, no HSM Ext:  No erythema, edema, or tenderness BUE/BLE  Psych: A&Ox3  Skin: no rashes noted, warm and dry  Lab Results:   Recent Labs  10/15/16 1516  WBC 14.5*  HGB 16.4  HCT 45.5  PLT 287   BMET  Recent Labs  10/15/16 1516  NA 141  K 3.9  CL 104  CO2 27  GLUCOSE 90  BUN 13  CREATININE 0.81  CALCIUM 8.9   PT/INR No results for input(s): LABPROT, INR in the last 72 hours. CMP     Component Value Date/Time   NA 141 10/15/2016 1516   K 3.9 10/15/2016 1516   CL 104 10/15/2016 1516   CO2 27 10/15/2016 1516   GLUCOSE 90 10/15/2016 1516   GLUCOSE 93 02/09/2006 1054   BUN 13 10/15/2016 1516   CREATININE 0.81 10/15/2016 1516   CALCIUM 8.9 10/15/2016 1516   PROT 7.5 10/15/2016 1516   ALBUMIN 3.7 10/15/2016 1516   AST 22 10/15/2016 1516   ALT 17 10/15/2016 1516   ALKPHOS 49 10/15/2016 1516   BILITOT 1.1 10/15/2016 1516   GFRNONAA >60 10/15/2016 1516   GFRAA >60 10/15/2016 1516   Lipase      Component Value Date/Time   LIPASE 20 10/15/2016 1516       Studies/Results: US Abdomen Limited Ruq  Result Date: 10/15/2016 CLINICAL DATA:  Right upper quadrant pain. EXAM: ULTRASOUND ABDOMEN LIMITED RIGHT UPPER QUADRANT COMPARISON:  None. FINDINGS: Gallbladder: Stones and sludge are seen in the gallbladder with no Murphy's sign. Mild wall thickening is seen. No pericholecystic fluid. Common bile duct: Diameter: 4.4 mm Liver: No focal lesion identified. Within normal limits in parenchymal echogenicity. IMPRESSION: 1. Stones and sludge are seen in the gallbladder with mild wall thickening but no pericholecystic fluid or Murphy's sign. A HIDA scan may further evaluate if there is concern for acute cholecystitis. Electronically Signed   By: Dorise Bullion III M.D   On: 10/15/2016 20:21    Anti-infectives: Anti-infectives    Start     Dose/Rate Route Frequency Ordered Stop   10/15/16 2345  cefTRIAXone (ROCEPHIN) 2 g in dextrose 5 % 50 mL IVPB     2 g 100 mL/hr over 30 Minutes Intravenous Daily at bedtime 10/15/16 2328         Assessment/Plan GERD  Acute cholecystitis - u/s shows stones and sludge are seen in the gallbladder with mild wall thickening but no pericholecystic fluid or Murphy's sign -  LFTs and lipase WNL - WBC 14.5, TMAX 101.2  ID - rocephin 7/1>> FEN - IVF, NPO VTE - SCDs, hold heparin for possible procedure  Plan - Lap chole with possible IOC tomorrow. [place] for clear liquids today, NPO after midnight. Hold heparin after midnight. Labs in AM.   LOS: 0 days    Jerrye Beavers , [facility] 10/16/2016, 9:25 AM Pager: [phone] Consults: [phone] Mon-Fri 7:00 am-4:30 pm Sat-Sun 7:00 am-11:30 am

## 2016-10-17 ENCOUNTER — Encounter (HOSPITAL_COMMUNITY)
Admission: EM | Disposition: A | Discharge status: Home / Self Care | Payer: Self-pay | Source: Home / Self Care | Attending: Emergency Medicine

## 2016-10-17 ENCOUNTER — Encounter (HOSPITAL_COMMUNITY): Payer: Self-pay | Admitting: Certified Registered Nurse Anesthetist

## 2016-10-17 ENCOUNTER — Observation Stay (HOSPITAL_COMMUNITY): Payer: 59

## 2016-10-17 ENCOUNTER — Observation Stay (HOSPITAL_COMMUNITY): Payer: 59 | Admitting: Certified Registered Nurse Anesthetist

## 2016-10-17 HISTORY — PX: CHOLECYSTECTOMY: SHX55

## 2016-10-17 LAB — CBC
HCT: 38.5 % — ABNORMAL LOW (ref 39.0–52.0)
Hemoglobin: 13.8 g/dL (ref 13.0–17.0)
MCH: 32.6 pg (ref 26.0–34.0)
MCHC: 35.8 g/dL (ref 30.0–36.0)
MCV: 91 fL (ref 78.0–100.0)
PLATELETS: 230 10*3/uL (ref 150–400)
RBC: 4.23 MIL/uL (ref 4.22–5.81)
RDW: 12.6 % (ref 11.5–15.5)
WBC: 7.4 10*3/uL (ref 4.0–10.5)

## 2016-10-17 LAB — COMPREHENSIVE METABOLIC PANEL
ALBUMIN: 2.8 g/dL — AB (ref 3.5–5.0)
ALT: 90 U/L — ABNORMAL HIGH (ref 17–63)
AST: 69 U/L — AB (ref 15–41)
Alkaline Phosphatase: 106 U/L (ref 38–126)
Anion gap: 6 (ref 5–15)
BUN: 6 mg/dL (ref 6–20)
CHLORIDE: 107 mmol/L (ref 101–111)
CO2: 27 mmol/L (ref 22–32)
Calcium: 8.2 mg/dL — ABNORMAL LOW (ref 8.9–10.3)
Creatinine, Ser: 0.74 mg/dL (ref 0.61–1.24)
GFR calc Af Amer: >60 mL/min (ref >60)
Glucose, Bld: 107 mg/dL — ABNORMAL HIGH (ref 65–99)
POTASSIUM: 3.8 mmol/L (ref 3.5–5.1)
SODIUM: 140 mmol/L (ref 135–145)
Total Bilirubin: 4.6 mg/dL — ABNORMAL HIGH (ref 0.3–1.2)
Total Protein: 5.7 g/dL — ABNORMAL LOW (ref 6.5–8.1)

## 2016-10-17 LAB — LIPASE, BLOOD: LIPASE: 21 U/L (ref 11–51)

## 2016-10-17 SURGERY — LAPAROSCOPIC CHOLECYSTECTOMY WITH INTRAOPERATIVE CHOLANGIOGRAM
Anesthesia: General | Site: Abdomen

## 2016-10-17 MED ORDER — SUGAMMADEX SODIUM 200 MG/2ML IV SOLN
INTRAVENOUS | Status: AC
Start: 2016-10-17 — End: 2016-10-17
  Filled 2016-10-17: qty 2

## 2016-10-17 MED ORDER — LACTATED RINGERS IR SOLN
Status: DC | PRN
  Administered 2016-10-17: 1000 mL

## 2016-10-17 MED ORDER — MORPHINE SULFATE (PF) 2 MG/ML IV SOLN
1.0000 mg | INTRAVENOUS | Status: DC | PRN
  Administered 2016-10-17: 1 mg via INTRAVENOUS
  Administered 2016-10-17: 2 mg via INTRAVENOUS
  Filled 2016-10-17 (×2): qty 1

## 2016-10-17 MED ORDER — DEXAMETHASONE SODIUM PHOSPHATE 4 MG/ML IJ SOLN
INTRAMUSCULAR | Status: DC | PRN
  Administered 2016-10-17: 10 mg via INTRAVENOUS

## 2016-10-17 MED ORDER — ONDANSETRON HCL 4 MG/2ML IJ SOLN
INTRAMUSCULAR | Status: DC | PRN
  Administered 2016-10-17: 4 mg via INTRAVENOUS

## 2016-10-17 MED ORDER — ROCURONIUM BROMIDE 50 MG/5ML IV SOSY
PREFILLED_SYRINGE | INTRAVENOUS | Status: DC | PRN
  Administered 2016-10-17: 50 mg via INTRAVENOUS
  Administered 2016-10-17: 10 mg via INTRAVENOUS

## 2016-10-17 MED ORDER — BUPIVACAINE-EPINEPHRINE (PF) 0.25% -1:200000 IJ SOLN
INTRAMUSCULAR | Status: AC
  Filled 2016-10-17: qty 30

## 2016-10-17 MED ORDER — HYDROCODONE-ACETAMINOPHEN 5-325 MG PO TABS: 1.0000 | ORAL_TABLET | ORAL | Status: DC | PRN

## 2016-10-17 MED ORDER — DEXAMETHASONE SODIUM PHOSPHATE 10 MG/ML IJ SOLN
INTRAMUSCULAR | Status: AC
  Filled 2016-10-17: qty 1

## 2016-10-17 MED ORDER — IOPAMIDOL (ISOVUE-300) INJECTION 61%
INTRAVENOUS | Status: DC | PRN
  Administered 2016-10-17: 50 mL via INTRAVENOUS

## 2016-10-17 MED ORDER — LACTATED RINGERS IV SOLN
INTRAVENOUS | Status: DC
  Administered 2016-10-17: 1000 mL via INTRAVENOUS

## 2016-10-17 MED ORDER — MEPERIDINE HCL 50 MG/ML IJ SOLN: 6.2500 mg | INTRAMUSCULAR | Status: DC | PRN

## 2016-10-17 MED ORDER — FENTANYL CITRATE (PF) 100 MCG/2ML IJ SOLN
INTRAMUSCULAR | Status: DC | PRN
  Administered 2016-10-17 (×5): 50 ug via INTRAVENOUS

## 2016-10-17 MED ORDER — ONDANSETRON HCL 4 MG/2ML IJ SOLN: 4.0000 mg | Freq: Once | INTRAMUSCULAR | Status: DC | PRN

## 2016-10-17 MED ORDER — ONDANSETRON HCL 4 MG/2ML IJ SOLN
INTRAMUSCULAR | Status: AC
  Filled 2016-10-17: qty 2

## 2016-10-17 MED ORDER — 0.9 % SODIUM CHLORIDE (POUR BTL) OPTIME
TOPICAL | Status: DC | PRN
  Administered 2016-10-17: 1000 mL

## 2016-10-17 MED ORDER — ROCURONIUM BROMIDE 50 MG/5ML IV SOSY
PREFILLED_SYRINGE | INTRAVENOUS | Status: AC
  Filled 2016-10-17: qty 5

## 2016-10-17 MED ORDER — IOPAMIDOL (ISOVUE-300) INJECTION 61%
INTRAVENOUS | Status: AC
  Filled 2016-10-17: qty 50

## 2016-10-17 MED ORDER — FENTANYL CITRATE (PF) 250 MCG/5ML IJ SOLN
INTRAMUSCULAR | Status: AC
  Filled 2016-10-17: qty 5

## 2016-10-17 MED ORDER — PROPOFOL 10 MG/ML IV BOLUS
INTRAVENOUS | Status: DC | PRN
  Administered 2016-10-17: 160 mg via INTRAVENOUS

## 2016-10-17 MED ORDER — HYDROMORPHONE HCL 2 MG/ML IJ SOLN: 0.2500 mg | INTRAMUSCULAR | Status: DC | PRN

## 2016-10-17 MED ORDER — MIDAZOLAM HCL 2 MG/2ML IJ SOLN
INTRAMUSCULAR | Status: AC
  Filled 2016-10-17: qty 2

## 2016-10-17 MED ORDER — BUPIVACAINE-EPINEPHRINE 0.25% -1:200000 IJ SOLN
INTRAMUSCULAR | Status: DC | PRN
  Administered 2016-10-17: 21 mL

## 2016-10-17 MED ORDER — SUGAMMADEX SODIUM 200 MG/2ML IV SOLN
INTRAVENOUS | Status: DC | PRN
  Administered 2016-10-17: 180 mg via INTRAVENOUS

## 2016-10-17 MED ORDER — LIDOCAINE 2% (20 MG/ML) 5 ML SYRINGE
INTRAMUSCULAR | Status: DC | PRN
  Administered 2016-10-17: 50 mg via INTRAVENOUS

## 2016-10-17 MED ORDER — LIDOCAINE 2% (20 MG/ML) 5 ML SYRINGE
INTRAMUSCULAR | Status: AC
Start: 2016-10-17 — End: 2016-10-17
  Filled 2016-10-17: qty 5

## 2016-10-17 MED ORDER — PROPOFOL 10 MG/ML IV BOLUS
INTRAVENOUS | Status: AC
  Filled 2016-10-17: qty 20

## 2016-10-17 MED ORDER — MIDAZOLAM HCL 5 MG/5ML IJ SOLN
INTRAMUSCULAR | Status: DC | PRN
  Administered 2016-10-17 (×2): 1 mg via INTRAVENOUS

## 2016-10-17 SURGICAL SUPPLY — 33 items
ADH SKN CLS APL DERMABOND .7 (GAUZE/BANDAGES/DRESSINGS) ×1
APPLIER CLIP 5 13 M/L LIGAMAX5 (MISCELLANEOUS) ×3
APR CLP MED LRG 5 ANG JAW (MISCELLANEOUS) ×1
BAG SPEC RTRVL LRG 6X4 10 (ENDOMECHANICALS) ×1
CABLE HIGH FREQUENCY MONO STRZ (ELECTRODE) ×3 IMPLANT
CATH REDDICK CHOLANGI 4FR 50CM (CATHETERS) ×3 IMPLANT
CHLORAPREP W/TINT 26ML (MISCELLANEOUS) ×3 IMPLANT
CLIP APPLIE 5 13 M/L LIGAMAX5 (MISCELLANEOUS) ×1 IMPLANT
COVER MAYO STAND STRL (DRAPES) ×3 IMPLANT
DECANTER SPIKE VIAL GLASS SM (MISCELLANEOUS) ×1 IMPLANT
DERMABOND ADVANCED (GAUZE/BANDAGES/DRESSINGS) ×2
DERMABOND ADVANCED .7 DNX12 (GAUZE/BANDAGES/DRESSINGS) ×1 IMPLANT
DRAPE C-ARM 42X120 X-RAY (DRAPES) ×3 IMPLANT
ELECT REM PT RETURN 15FT ADLT (MISCELLANEOUS) ×3 IMPLANT
GLOVE BIO SURGEON STRL SZ7.5 (GLOVE) ×3 IMPLANT
GOWN STRL REUS W/TWL XL LVL3 (GOWN DISPOSABLE) ×9 IMPLANT
HEMOSTAT SURGICEL 4X8 (HEMOSTASIS) IMPLANT
IRRIG SUCT STRYKERFLOW 2 WTIP (MISCELLANEOUS)
IRRIGATION SUCT STRKRFLW 2 WTP (MISCELLANEOUS) ×1 IMPLANT
IV CATH 14GX2 1/4 (CATHETERS) ×3 IMPLANT
KIT BASIN OR (CUSTOM PROCEDURE TRAY) ×3 IMPLANT
POUCH SPECIMEN RETRIEVAL 10MM (ENDOMECHANICALS) ×3 IMPLANT
SCISSORS LAP 5X35 DISP (ENDOMECHANICALS) ×3 IMPLANT
SET IRRIG TUBING LAPAROSCOPIC (IRRIGATION / IRRIGATOR) ×2 IMPLANT
SLEEVE XCEL OPT CAN 5 100 (ENDOMECHANICALS) ×6 IMPLANT
SUT MNCRL AB 4-0 PS2 18 (SUTURE) ×3 IMPLANT
TOWEL OR 17X26 10 PK STRL BLUE (TOWEL DISPOSABLE) ×3 IMPLANT
TOWEL OR NON WOVEN STRL DISP B (DISPOSABLE) ×3 IMPLANT
TRAY LAPAROSCOPIC (CUSTOM PROCEDURE TRAY) ×3 IMPLANT
TROCAR BLADELESS OPT 5 100 (ENDOMECHANICALS) ×3 IMPLANT
TROCAR XCEL BLUNT TIP 100MML (ENDOMECHANICALS) ×3 IMPLANT
TROCAR XCEL NON-BLD 11X100MML (ENDOMECHANICALS) ×2 IMPLANT
TUBING INSUF HEATED (TUBING) ×3 IMPLANT

## 2016-10-17 NOTE — Transfer of Care (Signed)
Immediate Anesthesia Transfer of Care Note  Patient: Sergio Nelson  Procedure(s) Performed: Procedure(s): LAPAROSCOPIC CHOLECYSTECTOMY WITH  INTRAOPERATIVE CHOLANGIOGRAM (N/A)  Patient Location: PACU  Anesthesia Type:General  Level of Consciousness: Patient easily awoken, sedated, comfortable, cooperative, following commands, responds to stimulation.   Airway & Oxygen Therapy: Patient spontaneously breathing, ventilating well, oxygen via simple oxygen mask.  Post-op Assessment: Report given to PACU RN, vital signs reviewed and stable, moving all extremities.   Post vital signs: Reviewed and stable.  Complications: No apparent anesthesia complications  Last Vitals:  Vitals:   10/16/16 2121 10/17/16 0442  BP: (!) 121/57 (!) 106/58  Pulse: 66 67  Resp: 18 18  Temp: 37.2 C 37.2 C    Last Pain:  Vitals:   10/17/16 0821  TempSrc:   PainSc: 1       Patients Stated Pain Goal: 4 (13/88/71 9597)  Complications: No apparent anesthesia complications

## 2016-10-17 NOTE — Anesthesia Procedure Notes (Signed)
Procedure Name: Intubation Date/Time: 10/17/2016 9:30 AM Performed by: Deliah Boston Pre-anesthesia Checklist: Patient identified, Emergency Drugs available, Suction available and Patient being monitored Patient Re-evaluated:Patient Re-evaluated prior to inductionOxygen Delivery Method: Circle system utilized Preoxygenation: Pre-oxygenation with 100% oxygen Intubation Type: IV induction Ventilation: Mask ventilation without difficulty Grade View: Grade I Tube type: Oral Tube size: 7.5 mm Number of attempts: 1 Airway Equipment and Method: Stylet and Oral airway Placement Confirmation: ETT inserted through vocal cords under direct vision,  positive ETCO2 and breath sounds checked- equal and bilateral Secured at: 22 cm Tube secured with: Tape Dental Injury: Teeth and Oropharynx as per pre-operative assessment  Comments: Intubation by EMT student. EMT student supervised 100%

## 2016-10-17 NOTE — Anesthesia Postprocedure Evaluation (Signed)
Anesthesia Post Note  Patient: Sergio Nelson  Procedure(s) Performed: Procedure(s) (LRB): LAPAROSCOPIC CHOLECYSTECTOMY WITH  INTRAOPERATIVE CHOLANGIOGRAM (N/A)     Patient location during evaluation: PACU Anesthesia Type: General Level of consciousness: awake and alert Pain management: pain level controlled Vital Signs Assessment: post-procedure vital signs reviewed and stable Respiratory status: spontaneous breathing, nonlabored ventilation, respiratory function stable and patient connected to nasal cannula oxygen Cardiovascular status: blood pressure returned to baseline and stable Postop Assessment: no signs of nausea or vomiting Anesthetic complications: no    Last Vitals:  Vitals:   10/17/16 1344 10/17/16 1448  BP: 138/67 (!) 146/82  Pulse: 70 73  Resp: 16 15  Temp: 36.6 C 36.5 C    Last Pain:  Vitals:   10/17/16 1448  TempSrc: Axillary  PainSc: 5                  [name]

## 2016-10-17 NOTE — Anesthesia Preprocedure Evaluation (Signed)
Anesthesia Evaluation  Patient identified by MRN, date of birth, ID band Patient awake    Reviewed: Allergy & Precautions, NPO status , Patient's Chart, lab work & pertinent test results  Airway Mallampati: I  TM Distance: >3 FB Neck ROM: Full    Dental   Pulmonary    Pulmonary exam normal        Cardiovascular Normal cardiovascular exam     Neuro/Psych    GI/Hepatic GERD  Medicated and Controlled,  Endo/Other    Renal/GU      Musculoskeletal   Abdominal   Peds  Hematology   Anesthesia Other Findings   Reproductive/Obstetrics                             Anesthesia Physical Anesthesia Plan  ASA: II  Anesthesia Plan: General   Post-op Pain Management:    Induction: Intravenous  PONV Risk Score and Plan: 2 and Ondansetron, Dexamethasone and Treatment may vary due to age or medical condition  Airway Management Planned: Oral ETT  Additional Equipment:   Intra-op Plan:   Post-operative Plan: Extubation in OR  Informed Consent: I have reviewed the patients History and Physical, chart, labs and discussed the procedure including the risks, benefits and alternatives for the proposed anesthesia with the patient or authorized representative who has indicated his/her understanding and acceptance.     Plan Discussed with: CRNA and Surgeon  Anesthesia Plan Comments:         Anesthesia Quick Evaluation

## 2016-10-17 NOTE — Op Note (Signed)
10/15/2016 - 10/17/2016  11:00 AM  PATIENT:  Sergio Nelson  66 y.o. male  PRE-OPERATIVE DIAGNOSIS:  ACUTE CHOLECYSTITIS with cholelithiasis  POST-OPERATIVE DIAGNOSIS:  ACUTE CHOLECYSTITIS with cholelithiasis  PROCEDURE:  Procedure(s): LAPAROSCOPIC CHOLECYSTECTOMY WITH  INTRAOPERATIVE CHOLANGIOGRAM (N/A)  SURGEON:  Surgeon(s) and Role:    Jovita Kussmaul, MD - Primary  PHYSICIAN ASSISTANT:   ASSISTANTS: none   ANESTHESIA:   local and general  EBL:  Total I/O In: -  Out: 175 [Urine:175]  BLOOD ADMINISTERED:none  DRAINS: none   LOCAL MEDICATIONS USED:  MARCAINE     SPECIMEN:  Source of Specimen:  gallbladder  DISPOSITION OF SPECIMEN:  PATHOLOGY  COUNTS:  YES  TOURNIQUET:  * No tourniquets in log *  DICTATION: .Dragon Dictation   Procedure: After informed consent was obtained the patient was brought to the operating room and placed in the supine position on the operating room table. After adequate induction of general anesthesia the patient's abdomen was prepped with ChloraPrep allowed to dry and draped in usual sterile manner. An appropriate timeout was performed. The area below the umbilicus was infiltrated with quarter percent  Marcaine. A small incision was made with a 15 blade knife. The incision was carried down through the subcutaneous tissue bluntly with a hemostat and [employer] retractors. The linea alba was identified. The linea alba was incised with a 15 blade knife and each side was grasped with Coker clamps. The preperitoneal space was then probed with a hemostat until the peritoneum was opened and access was gained to the abdominal cavity. A 0 Vicryl pursestring stitch was placed in the fascia surrounding the opening. A Hassan cannula was then placed through the opening and anchored in place with the previously placed Vicryl purse string stitch. The abdomen was insufflated with carbon dioxide without difficulty. A laparoscope was inserted through the [facility] cannula in  the right upper quadrant was inspected. Next the epigastric region was infiltrated with % Marcaine. A small incision was made with a 15 blade knife. A 5 mm port was placed bluntly through this incision into the abdominal cavity under direct vision. Next 2 sites were chosen laterally on the right side of the abdomen for placement of 5 mm ports. Each of these areas was infiltrated with quarter percent Marcaine. Small stab incisions were made with a 15 blade knife. 5 mm ports were then placed bluntly through these incisions into the abdominal cavity under direct vision without difficulty. The gallbladder was severely inflamed. A blunt grasper was placed through the lateralmost 5 mm port and used to grasp the dome of the gallbladder and elevated anteriorly and superiorly. Another blunt grasper was placed through the other 5 mm port and used to retract the body and neck of the gallbladder. A dissector was placed through the epigastric port and using the electrocautery the peritoneal reflection at the gallbladder neck was opened. Blunt dissection was then carried out in this area until the gallbladder neck-cystic duct junction was readily identified and a good window was created. A single clip was placed on the gallbladder neck. A small  ductotomy was made just below the clip with laparoscopic scissors. A 14-gauge Angiocath was then placed through the anterior abdominal wall under direct vision. A Reddick cholangiogram catheter was then placed through the Angiocath and flushed. The catheter was then placed in the cystic duct and anchored in place with a clip. A cholangiogram was obtained that showed no filling defects good emptying into the duodenum an adequate length  on the cystic duct. The anchoring clip and catheters were then removed from the patient. 3 clips were placed proximally on the cystic duct and the duct was divided between the 2 sets of clips. Posterior to this the cystic artery was identified and again  dissected bluntly in a circumferential manner until a good window  was created. 2 clips were placed proximally and one distally on the artery and the artery was divided between the 2 sets of clips. Next a laparoscopic hook cautery device was used to separate the gallbladder from the liver bed. Prior to completely detaching the gallbladder from the liver bed the liver bed was inspected and several small bleeding points were coagulated with the electrocautery until the area was completely hemostatic. The gallbladder was then detached the rest of it from the liver bed without difficulty. A laparoscopic bag was inserted through the hassan port. The laparoscope was moved to the epigastric port. The gallbladder was placed within the bag and the bag was sealed.  The bag with the gallbladder was then removed with the [facility] cannula through the infraumbilical port without difficulty. The fascial defect was then closed with the previously placed Vicryl pursestring stitch as well as with another figure-of-eight 0 Vicryl stitch. The liver bed was inspected again and found to be hemostatic. Several stones were spilled and retrieved. The abdomen was irrigated with copious amounts of saline until the effluent was clear. The ports were then removed under direct vision without difficulty and were found to be hemostatic. The gas was allowed to escape. The skin incisions were all closed with interrupted 4-0 Monocryl subcuticular stitches. Dermabond dressings were applied. The patient tolerated the procedure well. At the end of the case all needle sponge and instrument counts were correct. The patient was then awakened and taken to recovery in stable condition  PLAN OF CARE: Admit to inpatient   PATIENT DISPOSITION:  PACU - hemodynamically stable.   Delay start of Pharmacological VTE agent (>24hrs) due to surgical blood loss or risk of bleeding: no

## 2016-10-17 NOTE — Interval H&P Note (Signed)
History and Physical Interval Note:  10/17/2016 9:15 AM  Sergio Nelson  has presented today for surgery, with the diagnosis of ACUTE CHOLECYSTITIS  The various methods of treatment have been discussed with the patient and family. After consideration of risks, benefits and other options for treatment, the patient has consented to  Procedure(s): LAPAROSCOPIC CHOLECYSTECTOMY WITH POSSIBLE INTRAOPERATIVE CHOLANGIOGRAM (N/A) as a surgical intervention .  The patient's history has been reviewed, patient examined, no change in status, stable for surgery.  I have reviewed the patient's chart and labs.  Questions were answered to the patient's satisfaction.     TOTH III,PAUL S

## 2016-10-17 NOTE — Discharge Instructions (Signed)

## 2016-10-18 LAB — HEPATIC FUNCTION PANEL
ALK PHOS: 106 U/L (ref 38–126)
ALT: 113 U/L — AB (ref 17–63)
AST: 72 U/L — AB (ref 15–41)
Albumin: 3.2 g/dL — ABNORMAL LOW (ref 3.5–5.0)
Bilirubin, Direct: 0.8 mg/dL — ABNORMAL HIGH (ref 0.1–0.5)
Indirect Bilirubin: 0.9 mg/dL (ref 0.3–0.9)
TOTAL PROTEIN: 6.5 g/dL (ref 6.5–8.1)
Total Bilirubin: 1.7 mg/dL — ABNORMAL HIGH (ref 0.3–1.2)

## 2016-10-18 MED ORDER — CHLORHEXIDINE GLUCONATE CLOTH 2 % EX PADS
6.0000 | MEDICATED_PAD | Freq: Every day | CUTANEOUS | Status: DC
  Administered 2016-10-18: 6 via TOPICAL

## 2016-10-18 MED ORDER — HYDROCODONE-ACETAMINOPHEN 5-325 MG PO TABS: 1.0000 | ORAL_TABLET | ORAL | 0 refills | Status: DC | PRN

## 2016-10-18 MED ORDER — MUPIROCIN 2 % EX OINT
1.0000 "application " | TOPICAL_OINTMENT | Freq: Two times a day (BID) | CUTANEOUS | Status: DC
  Administered 2016-10-18: 1 via NASAL
  Filled 2016-10-18: qty 22

## 2016-10-18 NOTE — Discharge Summary (Signed)
Physician Discharge Summary  Patient ID: Sergio Nelson MRN: [id number] DOB/AGE: [birth year] 66 y.o.  Admit date: 10/15/2016 Discharge date: 10/18/2016  Admission Diagnoses: Patient Active Problem List   Diagnosis Date Noted  . Elevated blood pressure reading without diagnosis of hypertension 10/15/2016  . Cholecystitis 10/15/2016  . RUQ pain 10/12/2016  . Chest pain 10/12/2016  . Hyperlipidemia 10/12/2016  . Routine general medical examination at a health care facility 07/15/2014     Discharge Diagnoses:  Active Problems:   Cholecystitis   Discharged Condition: stable  Hospital Course:  Pt was admitted to the floor with clinical and imaging findings consistent with acute calculous cholecystitis.  He underwent lap chole with normal cholangiogram 10/17/2016.  He did well overnight without n/v and with good pain control.  He is ambulatory and voiding independently.    He is planning to go to [place] this weekend via car.  I discussed with the patient and spouse that he needs to make sure to get out of car and walk around every 2-3 hours due to increased risk of DVT post op.  I also advised to start stool softeners and/or laxatives at home, especially if he is needing much narcotic or having trouble with BM. We discussed potential symptoms of bile leak and reviewed what to call about and/or go to ED in [place].    His pre op bili was elevated at 4.6  We are repeating today.  As long as improved, he can be discharged to home.    Consults: None  Significant Diagnostic Studies: labs: see epic.  Pre op bili 4.6.  Treatments: surgery: see above.  Discharge Exam: Blood pressure 112/63, pulse 69, temperature 99.1 F (37.3 C), temperature source Oral, resp. rate 16, height 5\' 8"  (1.727 m), weight 88.5 kg (195 lb), SpO2 93 %. General appearance: alert, cooperative and no distress Resp: breathing comfortably GI: soft, non distended, approp tender.  wounds c/d/i. Extremities: extremities normal,  atraumatic, no cyanosis or edema  Disposition: home  Discharge Instructions    Call MD for:  persistant nausea and vomiting    Complete by:  As directed    Call MD for:  redness, tenderness, or signs of infection (pain, swelling, redness, odor or green/yellow discharge around incision site)    Complete by:  As directed    Call MD for:  severe uncontrolled pain    Complete by:  As directed    Call MD for:  temperature >100.4    Complete by:  As directed    Diet - low sodium heart healthy    Complete by:  As directed    Increase activity slowly    Complete by:  As directed    No dressing needed    Complete by:  As directed      Allergies as of 10/18/2016   No Known Allergies     Medication List    TAKE these medications   acetaminophen 325 MG tablet Commonly known as:  TYLENOL Take 325 mg by mouth every 6 (six) hours as needed for moderate pain.   aspirin 81 MG tablet Take 1 tablet (81 mg total) by mouth daily.   calcium carbonate 500 MG chewable tablet Commonly known as:  TUMS - dosed in mg elemental calcium Chew 1 tablet by mouth 2 (two) times daily as needed for indigestion or heartburn.   fexofenadine-pseudoephedrine 60-120 MG 12 hr tablet Commonly known as:  ALLEGRA-D Take 1 tablet by mouth 2 (two) times daily as needed (allergies).  Fish Oil 1000 MG Caps Take 1 capsule by mouth daily.   HYDROcodone-acetaminophen 5-325 MG tablet Commonly known as:  NORCO/VICODIN Take 1-2 tablets by mouth every 4 (four) hours as needed for moderate pain.   ranitidine 150 MG tablet Commonly known as:  ZANTAC Take 150 mg by mouth 2 (two) times daily as needed for heartburn.      Follow-up [facility], [place]. Go on 10/31/2016.   Specialty:  General Surgery Why:  Your appointment is 10/1716 at 10:00AM. Please arrive 30 minutes prior to your appointment to check in and fill out necessary paperwork. Contact information: [address] [place] [phone]          Signed: Stark Klein 10/18/2016, 9:43 AM

## 2016-10-18 NOTE — Progress Notes (Signed)
1 Day Post-Op   Subjective/Chief Complaint: Pain controlled post op.  No n/v.     Objective: Vital signs in last 24 hours: Temp:  [97.7 F (36.5 C)-99.1 F (37.3 C)] 99.1 F (37.3 C) (07/04 0508) Pulse Rate:  [65-75] 69 (07/04 0508) Resp:  [12-18] 16 (07/04 0508) BP: (106-146)/(54-82) 112/63 (07/04 0508) SpO2:  [93 %-99 %] 93 % (07/04 0508) Last BM Date: 10/15/16  Intake/Output from previous day: 07/03 0701 - 07/04 0700 In: 4490 [P.O.:1140; I.V.:3300; IV Piggyback:50] Out: 2285 [Urine:2275; Blood:10] Intake/Output this shift: Total I/O In: 50 [P.O.:50] Out: 175 [Urine:175]  General appearance: alert, cooperative and no distress Resp: breathing comfortably GI: soft, non distended.  wounds d/c/i.    Lab Results:   Recent Labs  10/15/16 1516 10/17/16 0434  WBC 14.5* 7.4  HGB 16.4 13.8  HCT 45.5 38.5*  PLT 287 230   BMET  Recent Labs  10/15/16 1516 10/17/16 0434  NA 141 140  K 3.9 3.8  CL 104 107  CO2 27 27  GLUCOSE 90 107*  BUN 13 6  CREATININE 0.81 0.74  CALCIUM 8.9 8.2*   PT/INR No results for input(s): LABPROT, INR in the last 72 hours. ABG No results for input(s): PHART, HCO3 in the last 72 hours.  Invalid input(s): PCO2, PO2  Studies/Results: Dg Cholangiogram Operative  Result Date: 10/17/2016 CLINICAL DATA:  Intraoperative cholangiogram during laparoscopic cholecystectomy. EXAM: INTRAOPERATIVE CHOLANGIOGRAM FLUOROSCOPY TIME:  10 seconds (2.1 MGy) COMPARISON:  Right upper quadrant abdominal ultrasound - 10/16/2026 FINDINGS: Intraoperative cholangiographic images of the right upper abdominal quadrant during laparoscopic cholecystectomy are provided for review. Surgical clips overlie the expected location of the gallbladder fossa. Contrast injection demonstrates selective cannulation of the central aspect of the cystic duct. There is passage of contrast through the central aspect of the cystic duct with filling of a non dilated common bile duct. There  is passage of contrast though the CBD and into the descending portion of the duodenum. There is minimal reflux of injected contrast into the common hepatic duct and central aspect of the non dilated intrahepatic biliary system. There are no discrete filling defects within the opacified portions of the biliary system to suggest the presence of choledocholithiasis. IMPRESSION: No evidence of choledocholithiasis. Electronically Signed   By: Sandi Mariscal M.D.   On: 10/17/2016 10:39    Anti-infectives: Anti-infectives    Start     Dose/Rate Route Frequency Ordered Stop   10/15/16 2345  cefTRIAXone (ROCEPHIN) 2 g in dextrose 5 % 50 mL IVPB     2 g 100 mL/hr over 30 Minutes Intravenous Daily at bedtime 10/15/16 2328        Assessment/Plan: s/p Procedure(s): LAPAROSCOPIC CHOLECYSTECTOMY WITH  INTRAOPERATIVE CHOLANGIOGRAM (N/A) d/c today if LFTs trending down.   LOS: 0 days    Sergio Nelson 10/18/2016

## 2016-10-18 NOTE — Progress Notes (Signed)
Pt was discharged home today. Instructions were reviewed with patient, prescription was given and questions were answered. Pt was taken to main entrance via wheelchair by NT.

## 2016-11-13 ENCOUNTER — Ambulatory Visit: Payer: 59 | Admitting: Family

## 2016-11-14 ENCOUNTER — Telehealth (HOSPITAL_COMMUNITY): Payer: Self-pay | Admitting: Internal Medicine

## 2016-11-14 NOTE — Telephone Encounter (Signed)
10/16/2016 patient currently in the hosp 7/20 sent msg to Dr. Jenny Reichmann before scheduling/lb 7/20 Dr. Jenny Reichmann ok'd to hold off on stress test/ left msg for pt to call back to inform/lb  Pt will be removed from the workqueue.

## 2017-10-22 ENCOUNTER — Encounter: Payer: Self-pay | Admitting: Family Medicine

## 2017-10-22 DIAGNOSIS — J302 Other seasonal allergic rhinitis: Secondary | ICD-10-CM

## 2017-11-05 DIAGNOSIS — H25013 Cortical age-related cataract, bilateral: Secondary | ICD-10-CM | POA: Diagnosis not present

## 2017-11-05 DIAGNOSIS — H2513 Age-related nuclear cataract, bilateral: Secondary | ICD-10-CM | POA: Diagnosis not present

## 2017-12-03 ENCOUNTER — Ambulatory Visit (INDEPENDENT_AMBULATORY_CARE_PROVIDER_SITE_OTHER): Payer: Medicare Other | Admitting: Family Medicine

## 2017-12-03 ENCOUNTER — Encounter: Payer: Self-pay | Admitting: Family Medicine

## 2017-12-03 VITALS — BP 110/78 | HR 70 | Temp 98.0°F | Resp 14 | Ht 68.0 in | Wt 189.0 lb

## 2017-12-03 DIAGNOSIS — Z13220 Encounter for screening for lipoid disorders: Secondary | ICD-10-CM

## 2017-12-03 DIAGNOSIS — Z1159 Encounter for screening for other viral diseases: Secondary | ICD-10-CM | POA: Diagnosis not present

## 2017-12-03 DIAGNOSIS — Z1211 Encounter for screening for malignant neoplasm of colon: Secondary | ICD-10-CM | POA: Diagnosis not present

## 2017-12-03 DIAGNOSIS — Z125 Encounter for screening for malignant neoplasm of prostate: Secondary | ICD-10-CM | POA: Diagnosis not present

## 2017-12-03 DIAGNOSIS — Z23 Encounter for immunization: Secondary | ICD-10-CM

## 2017-12-03 DIAGNOSIS — E7800 Pure hypercholesterolemia, unspecified: Secondary | ICD-10-CM | POA: Diagnosis not present

## 2017-12-03 DIAGNOSIS — Z7689 Persons encountering health services in other specified circumstances: Secondary | ICD-10-CM | POA: Diagnosis not present

## 2017-12-03 DIAGNOSIS — Z0000 Encounter for general adult medical examination without abnormal findings: Secondary | ICD-10-CM | POA: Diagnosis not present

## 2017-12-03 MED ORDER — ZOSTER VAC RECOMB ADJUVANTED 50 MCG/0.5ML IM SUSR: 0.5000 mL | Freq: Once | INTRAMUSCULAR | 1 refills | Status: AC

## 2017-12-03 NOTE — Progress Notes (Signed)
Subjective:    Patient ID: Sergio Nelson, male    DOB: 1951/03/12, 67 y.o.   MRN: [id number]  HPI  Patient is a very pleasant 67 year old Caucasian male who is here today to establish care.  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  He denies any major concerns today.  He has never had a colonoscopy.  He would like to see GI for this.  He is due for Prevnar 13 this year and Pneumovax 23 next year.  He is due for prostate cancer screening.  It appears he is due for hepatitis C screening.  He is due for a flu shot in September.  He is due for the shingles vaccine, Shingrix.  He is also due for fasting lab work.  Otherwise he is doing well with no concerns. Past Medical History:  Diagnosis Date  . Allergy   . GERD (gastroesophageal reflux disease)    Past Surgical History:  Procedure Laterality Date  . CHOLECYSTECTOMY N/A 10/17/2016   Procedure: LAPAROSCOPIC CHOLECYSTECTOMY WITH  INTRAOPERATIVE CHOLANGIOGRAM;  Surgeon: Jovita Kussmaul, MD;  Location: [facility];  Service: General;  Laterality: N/A;  . NERVE TRANSFER     on left elbow (ulnaar neuropathy)   Current Outpatient Medications on File Prior to Visit  Medication Sig Dispense Refill  . cetirizine (ZYRTEC) 10 MG tablet Take 10 mg by mouth daily.    . Omega-3 Fatty Acids (FISH OIL) 1000 MG CAPS Take 1 capsule by mouth daily.    . fexofenadine-pseudoephedrine (ALLEGRA-D) 60-120 MG 12 hr tablet Take 1 tablet by mouth 2 (two) times daily as needed (allergies).     No current facility-administered medications on file prior to visit.    Allergies  Allergen Reactions  . Bextra [Valdecoxib]    Social History   Socioeconomic History  . Marital status: Married    Spouse name: Not on file  . Number of children: 2  . Years of education: 70  . Highest education level: Not on file  Occupational History  . Occupation: Glass blower/designer  . Financial resource strain: Not on file  . Food insecurity:    Worry: Not  on file    Inability: Not on file  . Transportation needs:    Medical: Not on file    Non-medical: Not on file  Tobacco Use  . Smoking status: Never Smoker  . Smokeless tobacco: Never Used  Substance and Sexual Activity  . Alcohol use: Yes    Alcohol/week: 0.0 standard drinks    Comment: occasional  . Drug use: No  . Sexual activity: Not on file  Lifestyle  . Physical activity:    Days per week: Not on file    Minutes per session: Not on file  . Stress: Not on file  Relationships  . Social connections:    Talks on phone: Not on file    Gets together: Not on file    Attends religious service: Not on file    Active member of club or organization: Not on file    Attends meetings of clubs or organizations: Not on file    Relationship status: Not on file  . Intimate partner violence:    Fear of current or ex partner: Not on file    Emotionally abused: Not on file    Physically abused: Not on file    Forced sexual activity: Not on file  Other Topics Concern  . Not on file  Social  History Narrative   Fun: Travel, work on cars, go to [employer], and play golf.    Denies any religious beliefs that effect health care.    Family History  Problem Relation Age of Onset  . Hypertension Mother   . Ulcers Mother   . Diabetes Father   . Heart disease Father   . Diabetes Paternal Aunt   . Diabetes Paternal Uncle   . Cancer Maternal Grandmother   . Diabetes Paternal Grandmother   . Cancer Paternal Grandmother   . Diabetes Paternal Grandfather   . Cancer Maternal Grandfather      Review of Systems  All other systems reviewed and are negative.      Objective:   Physical Exam  Constitutional: He is oriented to person, place, and time. He appears well-developed and well-nourished. No distress.  HENT:  Head: Normocephalic and atraumatic.  Right Ear: External ear normal.  Left Ear: External ear normal.  Nose: Nose normal.  Mouth/Throat: Oropharynx is clear and moist. No  oropharyngeal exudate.  Eyes: Pupils are equal, round, and reactive to light. Conjunctivae and EOM are normal. Right eye exhibits no discharge. Left eye exhibits no discharge. No scleral icterus.  Neck: Normal range of motion. Neck supple. No JVD present. No tracheal deviation present. No thyromegaly present.  Cardiovascular: Normal rate, regular rhythm, normal heart sounds and intact distal pulses. Exam reveals no gallop and no friction rub.  No murmur heard. Pulmonary/Chest: Effort normal and breath sounds normal. No stridor. No respiratory distress. He has no wheezes. He has no rales. He exhibits no tenderness.  Abdominal: Soft. Bowel sounds are normal. He exhibits no distension and no mass. There is no tenderness. There is no guarding.  Musculoskeletal: Normal range of motion. He exhibits no edema, tenderness or deformity.  Lymphadenopathy:    He has no cervical adenopathy.  Neurological: He is alert and oriented to person, place, and time. He displays normal reflexes. No cranial nerve deficit or sensory deficit. He exhibits normal muscle tone. Coordination normal.  Skin: Skin is warm. No rash noted. He is not diaphoretic. No erythema. No pallor.  Vitals reviewed.         Assessment & Plan:  Encounter to establish care with new doctor  Screening cholesterol level - Plan: CBC with Differential/Platelet, COMPLETE METABOLIC PANEL WITH GFR, Lipid panel  Prostate cancer screening - Plan: PSA  Encounter for hepatitis C screening test for low risk patient - Plan: Hepatitis C Antibody  Colon cancer screening - Plan: Ambulatory referral to Gastroenterology  Sickle exam today is completely normal.  Regarding his immunizations, the patient was given Prevnar 13 today.  I recommended Pneumovax 23 next year.  We sent a prescription for Shingrix to his pharmacy.  Tetanus vaccine is up-to-date.  Regarding cancer screening, I will schedule the patient to meet with GI for a colonoscopy.  I will check  for prostate cancer with a PSA.  Regarding his preventative health, I will screen the patient with a hepatitis C surface antibody.  I will also check a CBC, CMP, fasting lipid panel.  Regular anticipatory guidance is provided.

## 2017-12-04 LAB — COMPLETE METABOLIC PANEL WITH GFR
AG RATIO: 1.8 (calc) (ref 1.0–2.5)
ALT: 13 U/L (ref 9–46)
AST: 17 U/L (ref 10–35)
Albumin: 4.1 g/dL (ref 3.6–5.1)
Alkaline phosphatase (APISO): 64 U/L (ref 40–115)
BILIRUBIN TOTAL: 1.2 mg/dL (ref 0.2–1.2)
BUN: 13 mg/dL (ref 7–25)
CHLORIDE: 111 mmol/L — AB (ref 98–110)
CO2: 24 mmol/L (ref 20–32)
Calcium: 9.1 mg/dL (ref 8.6–10.3)
Creat: 0.86 mg/dL (ref 0.70–1.25)
GFR, Est African American: 105 mL/min/{1.73_m2} (ref >=60)
GFR, Est Non African American: 91 mL/min/{1.73_m2} (ref >=60)
GLOBULIN: 2.3 g/dL (ref 1.9–3.7)
Glucose, Bld: 100 mg/dL — ABNORMAL HIGH (ref 65–99)
POTASSIUM: 4.4 mmol/L (ref 3.5–5.3)
SODIUM: 146 mmol/L (ref 135–146)
Total Protein: 6.4 g/dL (ref 6.1–8.1)

## 2017-12-04 LAB — CBC WITH DIFFERENTIAL/PLATELET
BASOS PCT: 1.2 %
Basophils Absolute: 104 cells/uL (ref 0–200)
EOS ABS: 426 {cells}/uL (ref 15–500)
Eosinophils Relative: 4.9 %
HEMATOCRIT: 48.8 % (ref 38.5–50.0)
Hemoglobin: 16.8 g/dL (ref 13.2–17.1)
Lymphs Abs: 2375 cells/uL (ref 850–3900)
MCH: 31.7 pg (ref 27.0–33.0)
MCHC: 34.4 g/dL (ref 32.0–36.0)
MCV: 92.1 fL (ref 80.0–100.0)
MPV: 9.4 fL (ref 7.5–12.5)
Monocytes Relative: 11.6 %
NEUTROS PCT: 55 %
Neutro Abs: 4785 cells/uL (ref 1500–7800)
PLATELETS: 348 10*3/uL (ref 140–400)
RBC: 5.3 10*6/uL (ref 4.20–5.80)
RDW: 12.7 % (ref 11.0–15.0)
TOTAL LYMPHOCYTE: 27.3 %
WBC: 8.7 10*3/uL (ref 3.8–10.8)
WBCMIX: 1009 {cells}/uL — AB (ref 200–950)

## 2017-12-04 LAB — HEPATITIS C ANTIBODY
Hepatitis C Ab: NONREACTIVE
SIGNAL TO CUT-OFF: 0.01 (ref <1.00)

## 2017-12-04 LAB — LIPID PANEL
Cholesterol: 139 mg/dL (ref <200)
HDL: 49 mg/dL (ref >40)
LDL Cholesterol (Calc): 77 mg/dL (calc)
Non-HDL Cholesterol (Calc): 90 mg/dL (calc) (ref <130)
TRIGLYCERIDES: 51 mg/dL (ref <150)
Total CHOL/HDL Ratio: 2.8 (calc) (ref <5.0)

## 2017-12-04 LAB — PSA: PSA: 0.7 ng/mL (ref <=4.0)

## 2017-12-14 ENCOUNTER — Encounter: Payer: Self-pay | Admitting: Gastroenterology

## 2017-12-20 DIAGNOSIS — Z23 Encounter for immunization: Secondary | ICD-10-CM | POA: Diagnosis not present

## 2018-01-17 ENCOUNTER — Ambulatory Visit (AMBULATORY_SURGERY_CENTER): Payer: Self-pay | Admitting: *Deleted

## 2018-01-17 VITALS — Ht 68.0 in | Wt 190.0 lb

## 2018-01-17 DIAGNOSIS — Z1211 Encounter for screening for malignant neoplasm of colon: Secondary | ICD-10-CM

## 2018-01-17 MED ORDER — NA SULFATE-K SULFATE-MG SULF 17.5-3.13-1.6 GM/177ML PO SOLN: ORAL | 0 refills | Status: DC

## 2018-01-17 NOTE — Progress Notes (Signed)
Patient denies any allergies to eggs or soy. Patient denies any problems with anesthesia/sedation. Patient denies any oxygen use at home. Patient denies taking any diet/weight loss medications or blood thinners.  

## 2018-01-29 ENCOUNTER — Ambulatory Visit (AMBULATORY_SURGERY_CENTER): Payer: Medicare Other | Admitting: Gastroenterology

## 2018-01-29 ENCOUNTER — Encounter: Payer: Self-pay | Admitting: Gastroenterology

## 2018-01-29 VITALS — BP 102/66 | HR 74 | Temp 98.9°F | Resp 14 | Ht 68.0 in | Wt 190.0 lb

## 2018-01-29 DIAGNOSIS — K635 Polyp of colon: Secondary | ICD-10-CM | POA: Diagnosis not present

## 2018-01-29 DIAGNOSIS — D122 Benign neoplasm of ascending colon: Secondary | ICD-10-CM

## 2018-01-29 DIAGNOSIS — Z1211 Encounter for screening for malignant neoplasm of colon: Secondary | ICD-10-CM

## 2018-01-29 DIAGNOSIS — D127 Benign neoplasm of rectosigmoid junction: Secondary | ICD-10-CM | POA: Diagnosis not present

## 2018-01-29 DIAGNOSIS — D120 Benign neoplasm of cecum: Secondary | ICD-10-CM

## 2018-01-29 DIAGNOSIS — D123 Benign neoplasm of transverse colon: Secondary | ICD-10-CM | POA: Diagnosis not present

## 2018-01-29 DIAGNOSIS — I10 Essential (primary) hypertension: Secondary | ICD-10-CM | POA: Diagnosis not present

## 2018-01-29 DIAGNOSIS — D125 Benign neoplasm of sigmoid colon: Secondary | ICD-10-CM

## 2018-01-29 MED ORDER — SODIUM CHLORIDE 0.9 % IV SOLN: 500.0000 mL | Freq: Once | INTRAVENOUS | Status: DC

## 2018-01-29 NOTE — Progress Notes (Signed)
Pt's states no medical or surgical changes since previsit or office visit. 

## 2018-01-29 NOTE — Progress Notes (Signed)
Called to room to assist during endoscopic procedure.  Patient ID and intended procedure confirmed with present staff. Received instructions for my participation in the procedure from the performing physician.  

## 2018-01-29 NOTE — Patient Instructions (Signed)
YOU HAD AN ENDOSCOPIC PROCEDURE TODAY AT [facility]:   Refer to the procedure report that was given to you for any specific questions about what was found during the examination.  If the procedure report does not answer your questions, please call your gastroenterologist to clarify.  If you requested that your care partner not be given the details of your procedure findings, then the procedure report has been included in a sealed envelope for you to review at your convenience later.  YOU SHOULD EXPECT: Some feelings of bloating in the abdomen. Passage of more gas than usual.  Walking can help get rid of the air that was put into your GI tract during the procedure and reduce the bloating. If you had a lower endoscopy (such as a colonoscopy or flexible sigmoidoscopy) you may notice spotting of blood in your stool or on the toilet paper. If you underwent a bowel prep for your procedure, you may not have a normal bowel movement for a few days.  Please Note:  You might notice some irritation and congestion in your nose or some drainage.  This is from the oxygen used during your procedure.  There is no need for concern and it should clear up in a day or so.  SYMPTOMS TO REPORT IMMEDIATELY:   Following lower endoscopy (colonoscopy or flexible sigmoidoscopy):  Excessive amounts of blood in the stool  Significant tenderness or worsening of abdominal pains  Swelling of the abdomen that is new, acute  Fever of 100F or higher  For urgent or emergent issues, a gastroenterologist can be reached at any hour by calling [phone].   DIET:  We do recommend a small meal at first, but then you may proceed to your regular diet.  Drink plenty of fluids but you should avoid alcoholic beverages for 24 hours.  MEDICATIONS: Continue present medications.  Please see handouts given to you by your recovery nurse.  Consider follow-up with Dermatology for evaluation regarding perianal lesion noted  on perianal exam.  ACTIVITY:  You should plan to take it easy for the rest of today and you should NOT DRIVE or use heavy machinery until tomorrow (because of the sedation medicines used during the test).    FOLLOW UP: Our staff will call the number listed on your records the next business day following your procedure to check on you and address any questions or concerns that you may have regarding the information given to you following your procedure. If we do not reach you, we will leave a message.  However, if you are feeling well and you are not experiencing any problems, there is no need to return our call.  We will assume that you have returned to your regular daily activities without incident.  If any biopsies were taken you will be contacted by phone or by letter within the next 1-3 weeks.  Please call us at [phone] if you have not heard about the biopsies in 3 weeks.   Thank you for allowing [place] to provide for your healthcare needs today.  SIGNATURES/CONFIDENTIALITY: You and/or your care partner have signed paperwork which will be entered into your electronic medical record.  These signatures attest to the fact that that the information above on your After Visit Summary has been reviewed and is understood.  Full responsibility of the confidentiality of this discharge information lies with you and/or your care-partner.

## 2018-01-29 NOTE — Op Note (Addendum)
[facility] Patient Name: Sergio Nelson Procedure Date: 01/29/2018 1:33 PM MRN: [id number] Endoscopist: Remo Lipps P. Havery Moros , MD Age: 67 Referring MD:  Date of Birth: 05-Aug-1950 Gender: Male Account #: [id number] Procedure:                Colonoscopy Indications:              Screening for colorectal malignant neoplasm, This                            is the patient's first colonoscopy Medicines:                Monitored Anesthesia Care Procedure:                Pre-Anesthesia Assessment:                           - Prior to the procedure, a History and Physical                            was performed, and patient medications and                            allergies were reviewed. The patient's tolerance of                            previous anesthesia was also reviewed. The risks                            and benefits of the procedure and the sedation                            options and risks were discussed with the patient.                            All questions were answered, and informed consent                            was obtained. Prior Anticoagulants: The patient has                            taken no previous anticoagulant or antiplatelet                            agents. ASA Grade Assessment: II - A patient with                            mild systemic disease. After reviewing the risks                            and benefits, the patient was deemed in                            satisfactory condition to undergo the procedure.  After obtaining informed consent, the colonoscope                            was passed under direct vision. Throughout the                            procedure, the patient's blood pressure, pulse, and                            oxygen saturations were monitored continuously. The                            Colonoscope was introduced through the anus and                            advanced to the the  cecum, identified by                            appendiceal orifice and ileocecal valve. The                            colonoscopy was performed without difficulty. The                            patient tolerated the procedure well. The quality                            of the bowel preparation was good. The ileocecal                            valve, appendiceal orifice, and rectum were                            photographed. Scope In: 1:37:09 PM Scope Out: 2:09:10 PM Scope Withdrawal Time: 0 hours 26 minutes 58 seconds  Total Procedure Duration: 0 hours 32 minutes 1 second  Findings:                 The digital rectal exam was abnormal with a small                            perianal nodular area / ulceration.                           Two sessile polyps were found in the cecum. The                            polyps were 2 to 3 mm in size. These polyps were                            removed with a cold snare. Resection and retrieval                            were complete.  Two sessile polyps were found in the ascending                            colon. The polyps were 3 to 5 mm in size. These                            polyps were removed with a cold snare. Resection                            and retrieval were complete.                           A 4 mm polyp was found in the hepatic flexure. The                            polyp was sessile. The polyp was removed with a                            cold snare. Resection and retrieval were complete.                           A 5 mm polyp was found in the sigmoid colon. The                            polyp was sessile. The polyp was removed with a                            cold snare. Resection and retrieval were complete.                           A 3 mm polyp was found in the recto-sigmoid colon.                            The polyp was sessile. The polyp was removed with a                             cold snare. Resection and retrieval were complete.                           A few medium-mouthed diverticula were found in the                            transverse colon.                           Internal hemorrhoids were found during                            retroflexion. The hemorrhoids were small.                           The exam was otherwise without abnormality. Of note  the colon was tortous which prolonged the exam. Complications:            No immediate complications. Estimated blood loss:                            Minimal. Estimated Blood Loss:     Estimated blood loss was minimal. Impression:               - Abnormal digital rectal exam with small perianal                            nodularity / ulceration.                           - Two 2 to 3 mm polyps in the cecum, removed with a                            cold snare. Resected and retrieved.                           - Two 3 to 5 mm polyps in the ascending colon,                            removed with a cold snare. Resected and retrieved.                           - One 4 mm polyp at the hepatic flexure, removed                            with a cold snare. Resected and retrieved.                           - One 5 mm polyp in the sigmoid colon, removed with                            a cold snare. Resected and retrieved.                           - One 3 mm polyp at the recto-sigmoid colon,                            removed with a cold snare. Resected and retrieved.                           - Diverticulosis in the transverse colon.                           - Internal hemorrhoids.                           - The examination was otherwise normal. Recommendation:           - Patient has a contact number available for  emergencies. The signs and symptoms of potential                            delayed complications were discussed with the                             patient. Return to normal activities tomorrow.                            Written discharge instructions were provided to the                            patient.                           - Resume previous diet.                           - Continue present medications.                           - Await pathology results.                           - Repeat colonoscopy for surveillance based on                            pathology results.                           - Consideration for dermatology evaluation to                            evaluate perianal lesion noted on perianal exam Steven P. Armbruster, MD 01/29/2018 2:19:23 PM This report has been signed electronically.

## 2018-01-29 NOTE — Progress Notes (Signed)
Report given to PACU, vss 

## 2018-01-30 ENCOUNTER — Telehealth: Payer: Self-pay | Admitting: *Deleted

## 2018-01-30 NOTE — Telephone Encounter (Signed)
First follow up call attempt.  Left message on voicemail. 

## 2018-01-30 NOTE — Telephone Encounter (Signed)
Second follow up call attempt.  Left message on voicemail to call if any questions or concerns. 

## 2018-06-21 MED FILL — SHINGRIX 50 MCG SUS: 50 | 1 days supply | Qty: 1 | Fill #0

## 2018-08-17 IMAGING — DX DG CHEST 2V
2 series · 2 of 2 positions shown · non-contrast
Comparison: No prior

CLINICAL DATA: Right upper quadrant pain.  Chest pain.

EXAM:
CHEST  2 VIEW

[chest pa]
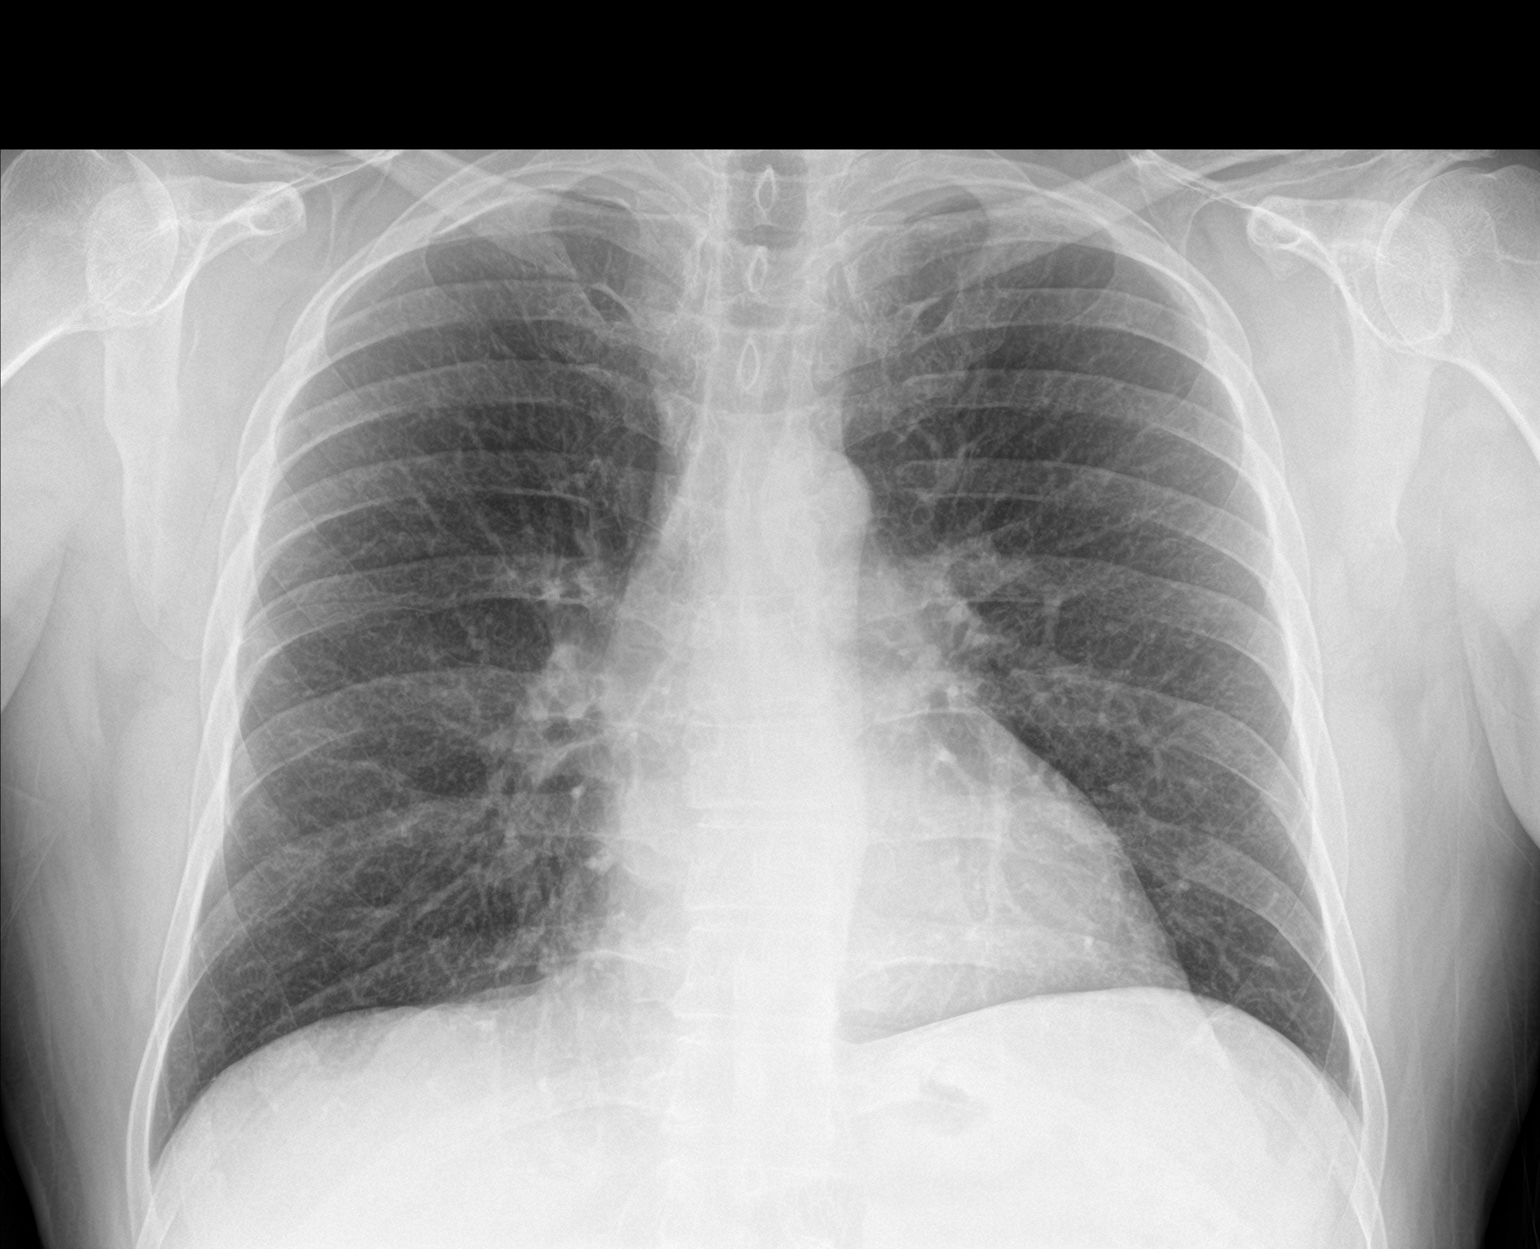

[chest lat]
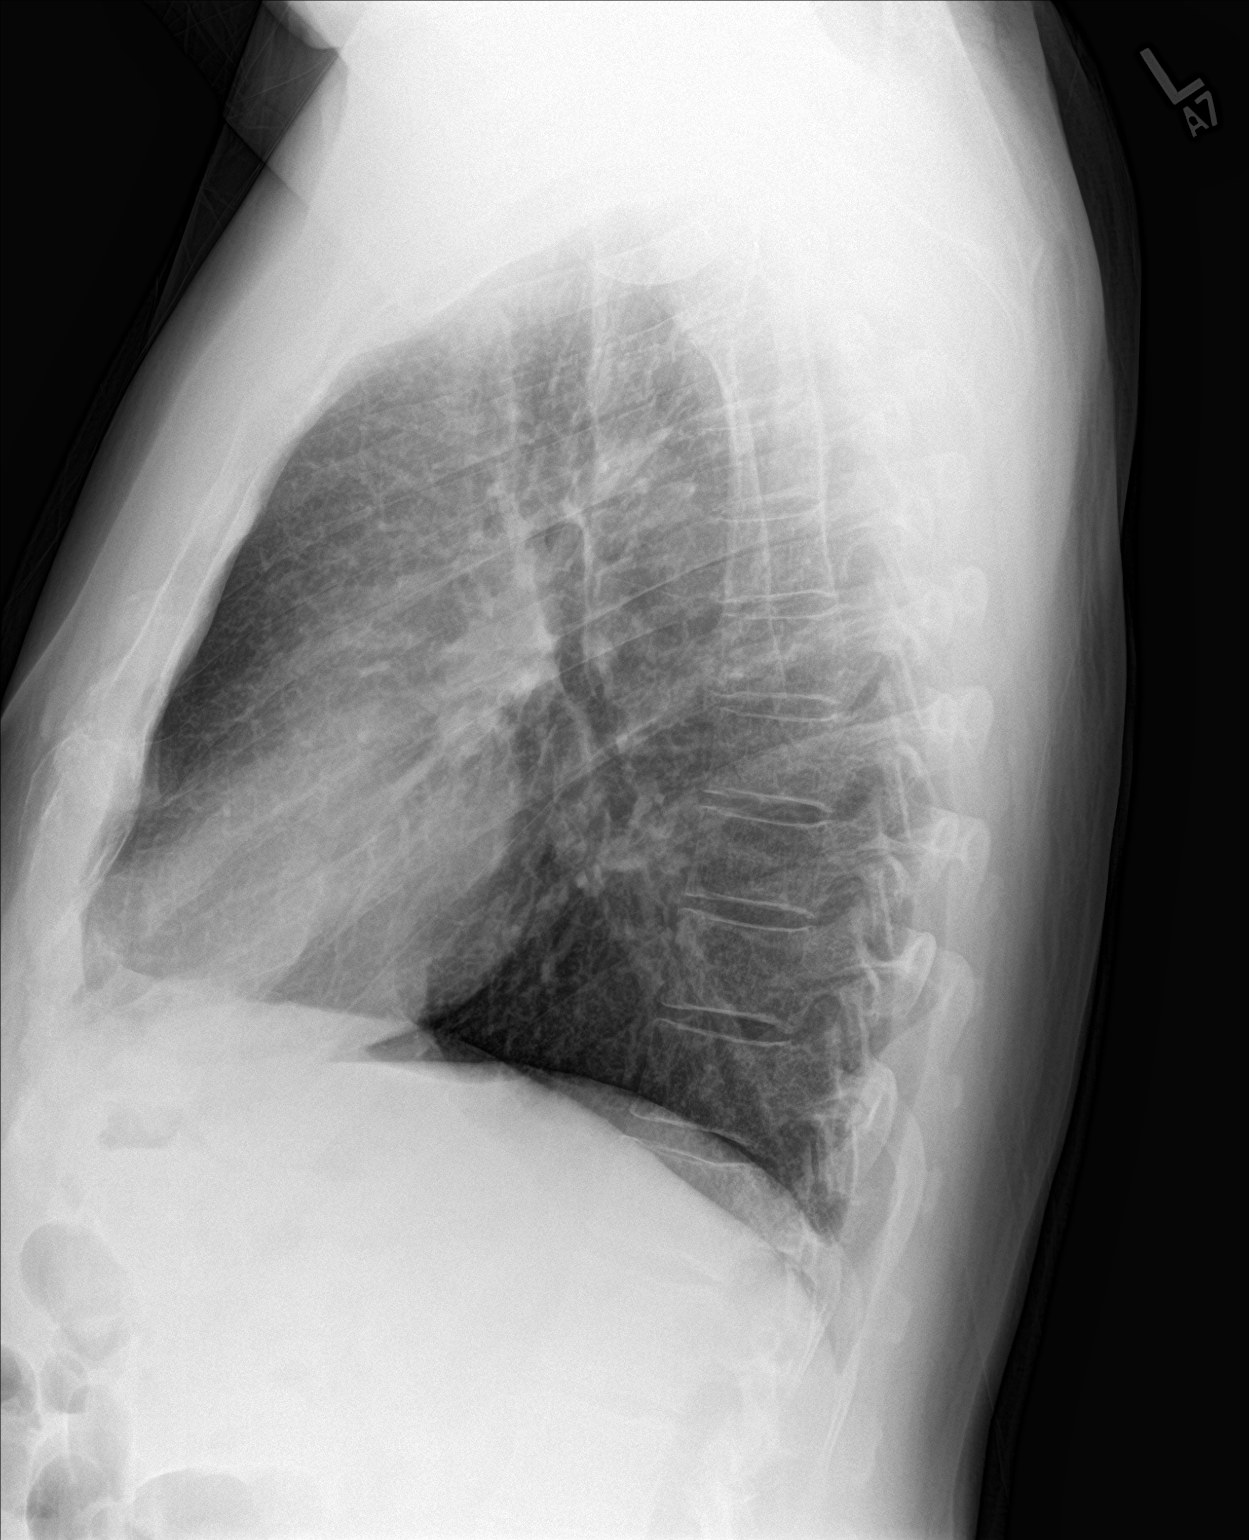

[2 of 2 positions shown; findings below may reference images not displayed]

FINDINGS: Mediastinum and hilar structures normal. Lungs are clear. No pleural
effusion or pneumothorax. No acute bony abnormality.
IMPRESSION: No acute cardiopulmonary disease.

## 2018-08-22 IMAGING — RF DG CHOLANGIOGRAM OPERATIVE
1 series · 4 of 4 positions shown · non-contrast
Comparison: Right upper quadrant abdominal ultrasound - 10/16/2026

CLINICAL DATA: Intraoperative cholangiogram during laparoscopic
cholecystectomy.

EXAM:
INTRAOPERATIVE CHOLANGIOGRAM
FLUOROSCOPY TIME:  10 seconds (2.1 MGy)

[Series 1: run · 4 of 62 frames shown]
[frame 10/62]
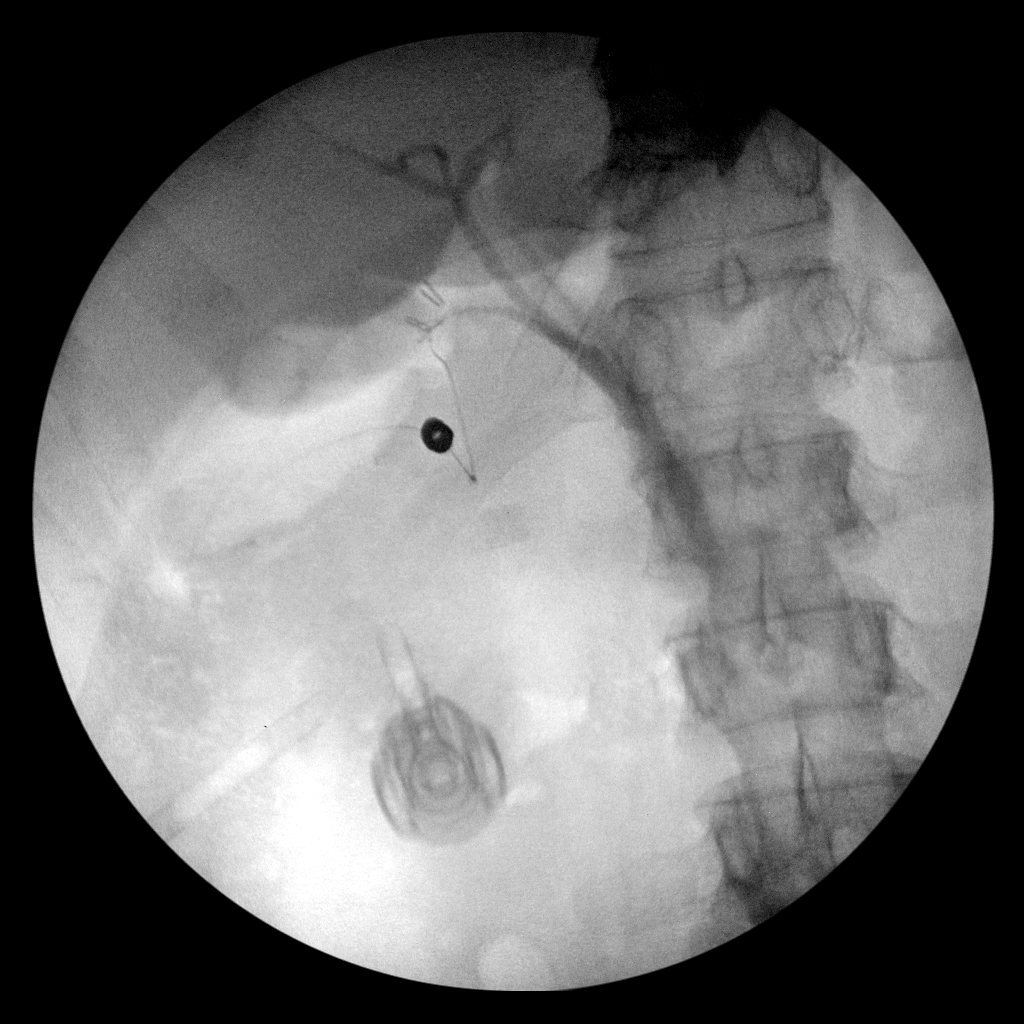
[frame 15/62]
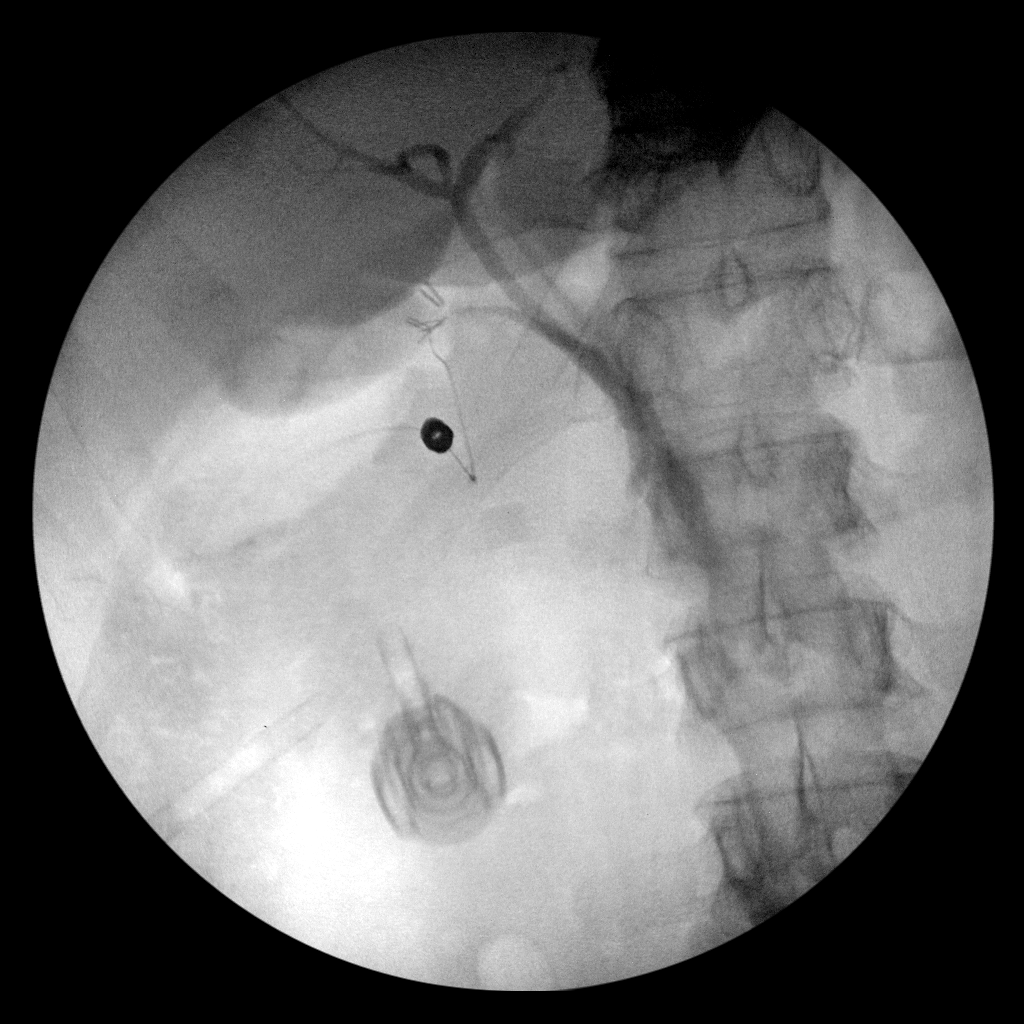
[frame 32/62]
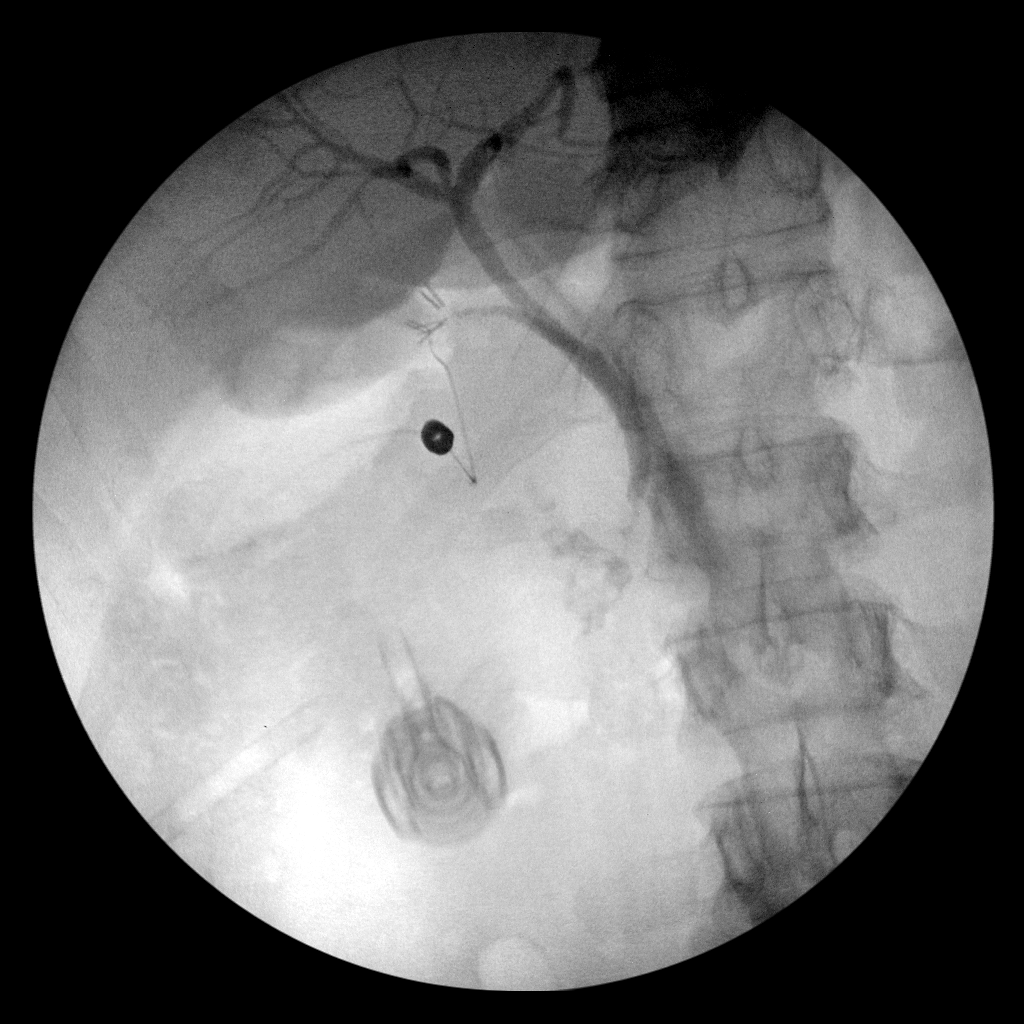
[frame 53/62]
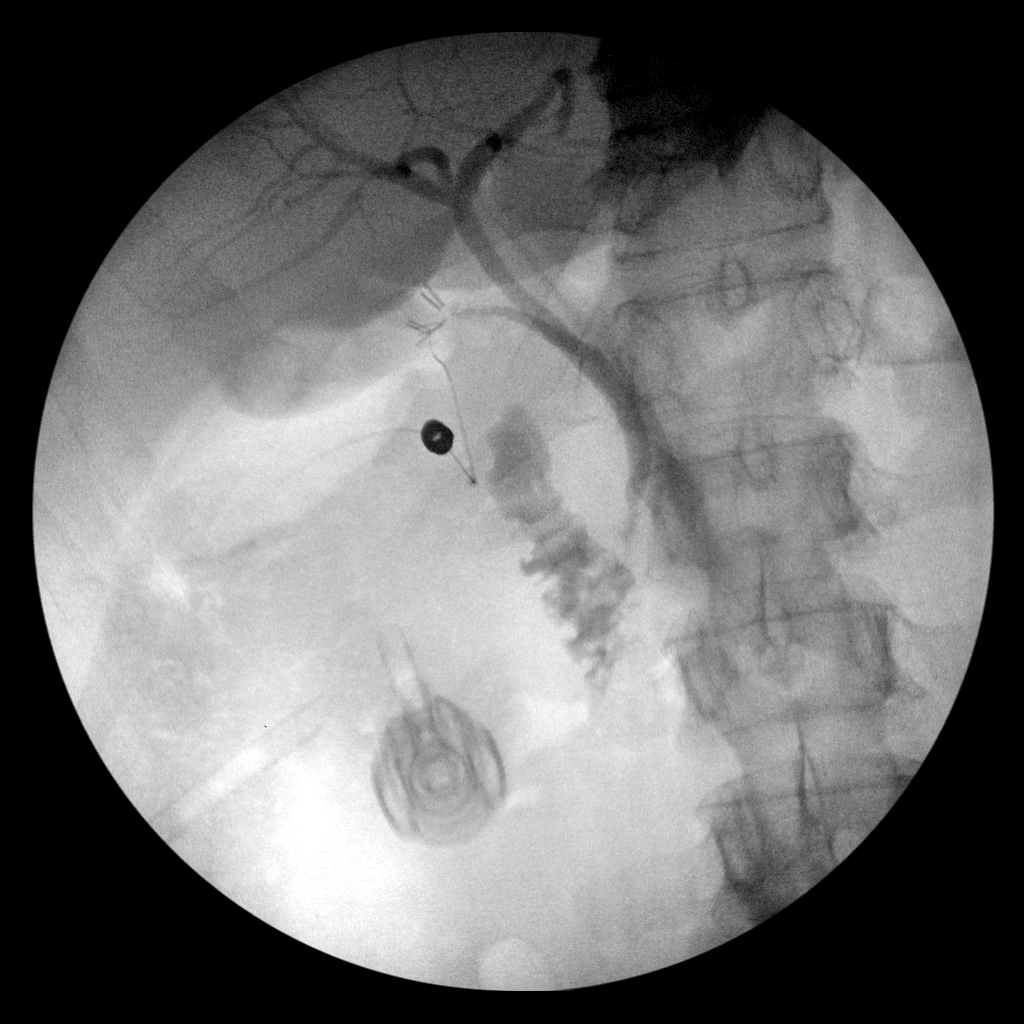

[4 of 4 positions shown; findings below may reference images not displayed]

FINDINGS: Intraoperative cholangiographic images of the right upper abdominal
quadrant during laparoscopic cholecystectomy are provided for
review.

Surgical clips overlie the expected location of the gallbladder
fossa.

Contrast injection demonstrates selective cannulation of the central
aspect of the cystic duct.

There is passage of contrast through the central aspect of the
cystic duct with filling of a non dilated common bile duct. There is
passage of contrast though the CBD and into the descending portion
of the duodenum.

There is minimal reflux of injected contrast into the common hepatic
duct and central aspect of the non dilated intrahepatic biliary
system.

There are no discrete filling defects within the opacified portions
of the biliary system to suggest the presence of
choledocholithiasis.
IMPRESSION: No evidence of choledocholithiasis.

## 2018-11-11 DIAGNOSIS — H524 Presbyopia: Secondary | ICD-10-CM | POA: Diagnosis not present

## 2018-11-11 DIAGNOSIS — H25813 Combined forms of age-related cataract, bilateral: Secondary | ICD-10-CM | POA: Diagnosis not present

## 2018-11-22 MED FILL — SHINGRIX 50 MCG SUS: 50 | 1 days supply | Qty: 1 | Fill #1

## 2019-02-01 DIAGNOSIS — Z23 Encounter for immunization: Secondary | ICD-10-CM | POA: Diagnosis not present

## 2019-04-18 HISTORY — PX: CATARACT EXTRACTION: SUR2

## 2019-11-11 DIAGNOSIS — H25813 Combined forms of age-related cataract, bilateral: Secondary | ICD-10-CM | POA: Diagnosis not present

## 2019-12-03 DIAGNOSIS — H25011 Cortical age-related cataract, right eye: Secondary | ICD-10-CM | POA: Diagnosis not present

## 2019-12-03 DIAGNOSIS — H2511 Age-related nuclear cataract, right eye: Secondary | ICD-10-CM | POA: Diagnosis not present

## 2019-12-03 DIAGNOSIS — H25812 Combined forms of age-related cataract, left eye: Secondary | ICD-10-CM | POA: Diagnosis not present

## 2019-12-16 DIAGNOSIS — H903 Sensorineural hearing loss, bilateral: Secondary | ICD-10-CM | POA: Diagnosis not present

## 2019-12-16 DIAGNOSIS — H838X3 Other specified diseases of inner ear, bilateral: Secondary | ICD-10-CM | POA: Diagnosis not present

## 2019-12-16 DIAGNOSIS — H9313 Tinnitus, bilateral: Secondary | ICD-10-CM | POA: Diagnosis not present

## 2019-12-17 DIAGNOSIS — H25012 Cortical age-related cataract, left eye: Secondary | ICD-10-CM | POA: Diagnosis not present

## 2019-12-17 DIAGNOSIS — H2512 Age-related nuclear cataract, left eye: Secondary | ICD-10-CM | POA: Diagnosis not present

## 2020-01-26 DIAGNOSIS — Z23 Encounter for immunization: Secondary | ICD-10-CM | POA: Diagnosis not present

## 2020-01-29 DIAGNOSIS — Z23 Encounter for immunization: Secondary | ICD-10-CM | POA: Diagnosis not present

## 2020-05-20 DIAGNOSIS — Z961 Presence of intraocular lens: Secondary | ICD-10-CM | POA: Diagnosis not present

## 2020-09-08 DIAGNOSIS — Z23 Encounter for immunization: Secondary | ICD-10-CM | POA: Diagnosis not present

## 2020-10-15 ENCOUNTER — Other Ambulatory Visit: Payer: Medicare Other

## 2020-10-15 ENCOUNTER — Other Ambulatory Visit: Payer: Self-pay

## 2020-10-15 DIAGNOSIS — Z136 Encounter for screening for cardiovascular disorders: Secondary | ICD-10-CM | POA: Diagnosis not present

## 2020-10-15 DIAGNOSIS — Z125 Encounter for screening for malignant neoplasm of prostate: Secondary | ICD-10-CM | POA: Diagnosis not present

## 2020-10-15 DIAGNOSIS — E785 Hyperlipidemia, unspecified: Secondary | ICD-10-CM | POA: Diagnosis not present

## 2020-10-15 DIAGNOSIS — Z13220 Encounter for screening for lipoid disorders: Secondary | ICD-10-CM | POA: Diagnosis not present

## 2020-10-21 ENCOUNTER — Encounter: Payer: Self-pay | Admitting: Family Medicine

## 2020-10-21 ENCOUNTER — Other Ambulatory Visit: Payer: Self-pay

## 2020-10-21 ENCOUNTER — Ambulatory Visit (INDEPENDENT_AMBULATORY_CARE_PROVIDER_SITE_OTHER): Payer: Medicare Other | Admitting: Family Medicine

## 2020-10-21 VITALS — BP 124/68 | HR 76 | Temp 98.2°F | Resp 14 | Ht 68.0 in | Wt 198.0 lb

## 2020-10-21 DIAGNOSIS — Z0000 Encounter for general adult medical examination without abnormal findings: Secondary | ICD-10-CM

## 2020-10-21 DIAGNOSIS — Z23 Encounter for immunization: Secondary | ICD-10-CM

## 2020-10-21 DIAGNOSIS — L918 Other hypertrophic disorders of the skin: Secondary | ICD-10-CM

## 2020-10-21 NOTE — Addendum Note (Signed)
Addended by: Sheral Flow on: 10/21/2020 04:40 PM   Modules accepted: Orders

## 2020-10-21 NOTE — Progress Notes (Signed)
Subjective:    Patient ID: Sergio Nelson, male    DOB: June 13, 1950, 70 y.o.   MRN: [id number]  HPI  Patient is a very pleasant 70 year old Caucasian male who is here today for CPE.  Patient actually is my godfather whom I have known my entire life.  He denies any major concerns today.  Patient had a colonoscopy in October 2019.  There were several adenomatous sessile polyps discovered.  Gastroenterology recommended a repeat colonoscopy in 3 years which would be now.  Otherwise, he is doing well with no concerns.  He is due for Pneumovax 23.  Has had 4 doses of the COVID-vaccine.  He would like to see a dermatologist for an annual skin exam.  He has several precancerous actinic keratoses on his forehead and numerous skin tags in his axilla that he would like a dermatologist to look at. Lab on 10/15/2020  Component Date Value Ref Range Status   Cholesterol 10/15/2020 153  <200 mg/dL Final   HDL 10/15/2020 49  > OR = 40 mg/dL Final   Triglycerides 10/15/2020 46  <150 mg/dL Final   LDL Cholesterol (Calc) 10/15/2020 90  mg/dL (calc) Final   Comment: Reference range: <100 . Desirable range <100 mg/dL for primary prevention;   <70 mg/dL for patients with CHD or diabetic patients  with > or = 2 CHD risk factors. Marland Kitchen LDL-C is now calculated using the Martin-Hopkins  calculation, which is a validated novel method providing  better accuracy than the Friedewald equation in the  estimation of LDL-C.  Cresenciano Genre et al. Annamaria Helling. 0932;671(24): 2061-2068  (http://education.[facility].com/faq/FAQ164)    Total CHOL/HDL Ratio 10/15/2020 3.1  <5.0 (calc) Final   Non-HDL Cholesterol (Calc) 10/15/2020 104  <130 mg/dL (calc) Final   Comment: For patients with diabetes plus 1 major ASCVD risk  factor, treating to a non-HDL-C goal of <100 mg/dL  (LDL-C of <70 mg/dL) is considered a therapeutic  option.    Glucose, Bld 10/15/2020 88  65 - 99 mg/dL Final   Comment: .            Fasting reference interval .     BUN 10/15/2020 12  7 - 25 mg/dL Final   Creat 10/15/2020 0.87  0.70 - 1.25 mg/dL Final   Comment: For patients >54 years of age, the reference limit for Creatinine is approximately 13% higher for people identified as African-American. .    GFR, Est Non African American 10/15/2020 89  > OR = 60 mL/min/1.9m2 Final   GFR, Est African American 10/15/2020 103  > OR = 60 mL/min/1.61m2 Final   BUN/Creatinine Ratio 58/12/9831 NOT APPLICABLE  6 - 22 (calc) Final   Sodium 10/15/2020 145  135 - 146 mmol/L Final   Potassium 10/15/2020 4.4  3.5 - 5.3 mmol/L Final   Chloride 10/15/2020 113 (A) 98 - 110 mmol/L Final   Comment: Verified by repeat analysis. .    CO2 10/15/2020 23  20 - 32 mmol/L Final   Calcium 10/15/2020 9.1  8.6 - 10.3 mg/dL Final   Total Protein 10/15/2020 6.0 (A) 6.1 - 8.1 g/dL Final   Albumin 10/15/2020 3.8  3.6 - 5.1 g/dL Final   Globulin 10/15/2020 2.2  1.9 - 3.7 g/dL (calc) Final   AG Ratio 10/15/2020 1.7  1.0 - 2.5 (calc) Final   Total Bilirubin 10/15/2020 1.2  0.2 - 1.2 mg/dL Final   Alkaline phosphatase (APISO) 10/15/2020 50  35 - 144 U/L Final   AST 10/15/2020 18  10 - 35 U/L Final   ALT 10/15/2020 13  9 - 46 U/L Final   WBC 10/15/2020 7.1  3.8 - 10.8 Thousand/uL Final   RBC 10/15/2020 5.16  4.20 - 5.80 Million/uL Final   Hemoglobin 10/15/2020 17.1  13.2 - 17.1 g/dL Final   HCT 10/15/2020 49.6  38.5 - 50.0 % Final   MCV 10/15/2020 96.1  80.0 - 100.0 fL Final   MCH 10/15/2020 33.1 (A) 27.0 - 33.0 pg Final   MCHC 10/15/2020 34.5  32.0 - 36.0 g/dL Final   RDW 10/15/2020 12.9  11.0 - 15.0 % Final   Platelets 10/15/2020 288  140 - 400 Thousand/uL Final   MPV 10/15/2020 9.6  7.5 - 12.5 fL Final   Neutro Abs 10/15/2020 3,174  1,500 - 7,800 cells/uL Final   Lymphs Abs 10/15/2020 2,648  850 - 3,900 cells/uL Final   Absolute Monocytes 10/15/2020 767  200 - 950 cells/uL Final   Eosinophils Absolute 10/15/2020 412  15 - 500 cells/uL Final   Basophils Absolute 10/15/2020 99   0 - 200 cells/uL Final   Neutrophils Relative % 10/15/2020 44.7  % Final   Total Lymphocyte 10/15/2020 37.3  % Final   Monocytes Relative 10/15/2020 10.8  % Final   Eosinophils Relative 10/15/2020 5.8  % Final   Basophils Relative 10/15/2020 1.4  % Final   Immunization History  Administered Date(s) Administered   Influenza, High Dose Seasonal PF 12/20/2017   Influenza-Unspecified 12/20/2017   Pneumococcal Conjugate-13 12/03/2017   Tdap 07/15/2014   Zoster Recombinat (Shingrix) 06/27/2018, 11/26/2018    Past Medical History:  Diagnosis Date   Allergy    GERD (gastroesophageal reflux disease)    Past Surgical History:  Procedure Laterality Date   CHOLECYSTECTOMY N/A 10/17/2016   Procedure: LAPAROSCOPIC CHOLECYSTECTOMY WITH  INTRAOPERATIVE CHOLANGIOGRAM;  Surgeon: Autumn Messing III, MD;  Location: [facility];  Service: General;  Laterality: N/A;   NERVE TRANSFER     on left elbow (ulnaar neuropathy)   Current Outpatient Medications on File Prior to Visit  Medication Sig Dispense Refill   cetirizine (ZYRTEC) 10 MG tablet Take 10 mg by mouth daily.     fexofenadine-pseudoephedrine (ALLEGRA-D) 60-120 MG 12 hr tablet Take 1 tablet by mouth 2 (two) times daily as needed (allergies).     Misc Natural Products (NEURIVA PO) Take by mouth.     No current facility-administered medications on file prior to visit.   Allergies  Allergen Reactions   Bextra [Valdecoxib] Other (See Comments)    Flu symptoms    Social History   Socioeconomic History   Marital status: Married    Spouse name: Not on file   Number of children: 2   Years of education: 16   Highest education level: Not on file  Occupational History   Occupation: Chief Financial Officer  Tobacco Use   Smoking status: Never   Smokeless tobacco: Never  Vaping Use   Vaping Use: Never used  Substance and Sexual Activity   Alcohol use: Yes    Alcohol/week: 0.0 standard drinks    Comment: occasional   Drug use: No   Sexual activity: Yes   Other Topics Concern   Not on file  Social History Narrative   Fun: Travel, work on cars, go to [employer], and play golf.    Denies any religious beliefs that effect health care.    Social Determinants of Health   Financial Resource Strain: Not on file  Food Insecurity: Not on file  Transportation  Needs: Not on file  Physical Activity: Not on file  Stress: Not on file  Social Connections: Not on file  Intimate Partner Violence: Not on file   Family History  Problem Relation Age of Onset   Hypertension Mother    Ulcers Mother    Diabetes Father    Heart disease Father    Colon polyps Father    Diabetes Paternal Aunt    Diabetes Paternal Uncle    Cancer Maternal Grandmother    Diabetes Paternal Grandmother    Cancer Paternal Grandmother    Diabetes Paternal Grandfather    Cancer Maternal Grandfather    Colon cancer Maternal Grandfather    Esophageal cancer Neg Hx    Rectal cancer Neg Hx    Stomach cancer Neg Hx      Review of Systems  All other systems reviewed and are negative.     Objective:   Physical Exam Vitals reviewed.  Constitutional:      General: He is not in acute distress.    Appearance: He is well-developed. He is not diaphoretic.  HENT:     Head: Normocephalic and atraumatic.     Right Ear: External ear normal.     Left Ear: External ear normal.     Nose: Nose normal.     Mouth/Throat:     Pharynx: No oropharyngeal exudate.  Eyes:     General: No scleral icterus.       Right eye: No discharge.        Left eye: No discharge.     Conjunctiva/sclera: Conjunctivae normal.     Pupils: Pupils are equal, round, and reactive to light.  Neck:     Thyroid: No thyromegaly.     Vascular: No JVD.     Trachea: No tracheal deviation.  Cardiovascular:     Rate and Rhythm: Normal rate and regular rhythm.     Heart sounds: Normal heart sounds. No murmur heard.   No friction rub. No gallop.  Pulmonary:     Effort: Pulmonary effort is normal. No  respiratory distress.     Breath sounds: Normal breath sounds. No stridor. No wheezing or rales.  Chest:     Chest wall: No tenderness.  Abdominal:     General: Bowel sounds are normal. There is no distension.     Palpations: Abdomen is soft. There is no mass.     Tenderness: There is no abdominal tenderness. There is no guarding.  Musculoskeletal:        General: No tenderness or deformity. Normal range of motion.     Cervical back: Normal range of motion and neck supple.  Lymphadenopathy:     Cervical: No cervical adenopathy.  Skin:    General: Skin is warm.     Coloration: Skin is not pale.     Findings: No erythema or rash.  Neurological:     Mental Status: He is alert and oriented to person, place, and time.     Cranial Nerves: No cranial nerve deficit.     Sensory: No sensory deficit.     Motor: No abnormal muscle tone.     Coordination: Coordination normal.     Deep Tendon Reflexes: Reflexes normal.          Assessment & Plan:  Cutaneous skin tags - Plan: Ambulatory referral to Dermatology  Routine general medical examination at a health care facility Physical exam today is completely normal.  Patient's colonoscopy is due.  He states that he  will call and schedule that appointment.  I will check a PSA to screen for prostate cancer.  The remainder of his lab work is outstanding.  Patient did receive Pneumovax 23 today.  I will also set up the patient to meet with a dermatologist to discuss an annual skin exam.  COVID vaccination is up-to-date.  He denies any falls, depression, or memory loss

## 2020-10-22 LAB — PSA: PSA: 0.38 ng/mL (ref <=4.00)

## 2020-10-22 LAB — CBC WITH DIFFERENTIAL/PLATELET
Absolute Monocytes: 767 cells/uL (ref 200–950)
Basophils Absolute: 99 cells/uL (ref 0–200)
Basophils Relative: 1.4 %
Eosinophils Absolute: 412 cells/uL (ref 15–500)
Eosinophils Relative: 5.8 %
HCT: 49.6 % (ref 38.5–50.0)
Hemoglobin: 17.1 g/dL (ref 13.2–17.1)
Lymphs Abs: 2648 cells/uL (ref 850–3900)
MCH: 33.1 pg — ABNORMAL HIGH (ref 27.0–33.0)
MCHC: 34.5 g/dL (ref 32.0–36.0)
MCV: 96.1 fL (ref 80.0–100.0)
MPV: 9.6 fL (ref 7.5–12.5)
Monocytes Relative: 10.8 %
Neutro Abs: 3174 cells/uL (ref 1500–7800)
Neutrophils Relative %: 44.7 %
Platelets: 288 10*3/uL (ref 140–400)
RBC: 5.16 10*6/uL (ref 4.20–5.80)
RDW: 12.9 % (ref 11.0–15.0)
Total Lymphocyte: 37.3 %
WBC: 7.1 10*3/uL (ref 3.8–10.8)

## 2020-10-22 LAB — COMPLETE METABOLIC PANEL WITH GFR
AG Ratio: 1.7 (calc) (ref 1.0–2.5)
ALT: 13 U/L (ref 9–46)
AST: 18 U/L (ref 10–35)
Albumin: 3.8 g/dL (ref 3.6–5.1)
Alkaline phosphatase (APISO): 50 U/L (ref 35–144)
BUN: 12 mg/dL (ref 7–25)
CO2: 23 mmol/L (ref 20–32)
Calcium: 9.1 mg/dL (ref 8.6–10.3)
Chloride: 113 mmol/L — ABNORMAL HIGH (ref 98–110)
Creat: 0.87 mg/dL (ref 0.70–1.25)
GFR, Est African American: 103 mL/min/{1.73_m2} (ref >=60)
GFR, Est Non African American: 89 mL/min/{1.73_m2} (ref >=60)
Globulin: 2.2 g/dL (calc) (ref 1.9–3.7)
Glucose, Bld: 88 mg/dL (ref 65–99)
Potassium: 4.4 mmol/L (ref 3.5–5.3)
Sodium: 145 mmol/L (ref 135–146)
Total Bilirubin: 1.2 mg/dL (ref 0.2–1.2)
Total Protein: 6 g/dL — ABNORMAL LOW (ref 6.1–8.1)

## 2020-10-22 LAB — LIPID PANEL
Cholesterol: 153 mg/dL (ref <200)
HDL: 49 mg/dL (ref >=40)
LDL Cholesterol (Calc): 90 mg/dL (calc)
Non-HDL Cholesterol (Calc): 104 mg/dL (calc) (ref <130)
Total CHOL/HDL Ratio: 3.1 (calc) (ref <5.0)
Triglycerides: 46 mg/dL (ref <150)

## 2020-10-22 LAB — TEST AUTHORIZATION

## 2020-11-16 DIAGNOSIS — L918 Other hypertrophic disorders of the skin: Secondary | ICD-10-CM | POA: Diagnosis not present

## 2020-11-16 DIAGNOSIS — D1801 Hemangioma of skin and subcutaneous tissue: Secondary | ICD-10-CM | POA: Diagnosis not present

## 2020-11-16 DIAGNOSIS — D225 Melanocytic nevi of trunk: Secondary | ICD-10-CM | POA: Diagnosis not present

## 2020-11-16 DIAGNOSIS — L821 Other seborrheic keratosis: Secondary | ICD-10-CM | POA: Diagnosis not present

## 2020-11-16 DIAGNOSIS — L570 Actinic keratosis: Secondary | ICD-10-CM | POA: Diagnosis not present

## 2020-12-23 ENCOUNTER — Encounter: Payer: Self-pay | Admitting: Gastroenterology

## 2021-01-11 DIAGNOSIS — Z23 Encounter for immunization: Secondary | ICD-10-CM | POA: Diagnosis not present

## 2021-01-14 ENCOUNTER — Other Ambulatory Visit: Payer: Self-pay

## 2021-01-14 ENCOUNTER — Ambulatory Visit (AMBULATORY_SURGERY_CENTER): Payer: Medicare Other | Admitting: *Deleted

## 2021-01-14 VITALS — Ht 68.0 in | Wt 197.0 lb

## 2021-01-14 DIAGNOSIS — Z86010 Personal history of colonic polyps: Secondary | ICD-10-CM

## 2021-01-14 NOTE — Progress Notes (Signed)
No egg or soy allergy known to patient  No issues known to pt with past sedation with any surgeries or procedures Patient denies ever being told they had issues or difficulty with intubation  No FH of Malignant Hyperthermia Pt is not on diet pills Pt is not on  home 02  Pt is not on blood thinners  Pt denies issues with constipation  No A fib or A flutter  EMMI video to pt or via MyChart   Pt is fully vaccinated  for Covid   Due to the COVID-19 pandemic we are asking patients to follow certain guidelines.  Pt aware of COVID protocols and [facility] guidelines  Pt verified name, DOB, address and insurance during PV today.  Pt mailed instruction packet of Emmi video, copy of consent form to read and not return, and instructions. PV completed over the phone.  Pt encouraged to call with questions or issues.  My Chart instructions to pt as well

## 2021-02-02 ENCOUNTER — Other Ambulatory Visit: Payer: Self-pay

## 2021-02-02 ENCOUNTER — Encounter: Payer: Self-pay | Admitting: Gastroenterology

## 2021-02-02 ENCOUNTER — Ambulatory Visit (AMBULATORY_SURGERY_CENTER): Payer: Medicare Other | Admitting: Gastroenterology

## 2021-02-02 VITALS — BP 105/56 | HR 74 | Temp 98.9°F | Resp 14 | Ht 68.0 in | Wt 197.0 lb

## 2021-02-02 DIAGNOSIS — D123 Benign neoplasm of transverse colon: Secondary | ICD-10-CM | POA: Diagnosis not present

## 2021-02-02 DIAGNOSIS — Z86010 Personal history of colonic polyps: Secondary | ICD-10-CM

## 2021-02-02 DIAGNOSIS — D120 Benign neoplasm of cecum: Secondary | ICD-10-CM | POA: Diagnosis not present

## 2021-02-02 MED ORDER — SODIUM CHLORIDE 0.9 % IV SOLN: 500.0000 mL | Freq: Once | INTRAVENOUS | Status: DC

## 2021-02-02 NOTE — Progress Notes (Signed)
Called to room to assist during endoscopic procedure.  Patient ID and intended procedure confirmed with present staff. Received instructions for my participation in the procedure from the performing physician.  

## 2021-02-02 NOTE — Op Note (Signed)
[facility] Patient Name: Cylan Borum Procedure Date: 02/02/2021 7:59 AM MRN: [id number] Endoscopist: Remo Lipps P. Havery Moros , MD Age: 70 Referring MD:  Date of Birth: 16-Nov-1950 Gender: Male Account #: [id number] Procedure:                Colonoscopy Indications:              High risk colon cancer surveillance: Personal                            history of colonic polyps (6 adenomas / sessile                            serrated polyps removed 01/2018) Medicines:                Monitored Anesthesia Care Procedure:                Pre-Anesthesia Assessment:                           - Prior to the procedure, a History and Physical                            was performed, and patient medications and                            allergies were reviewed. The patient's tolerance of                            previous anesthesia was also reviewed. The risks                            and benefits of the procedure and the sedation                            options and risks were discussed with the patient.                            All questions were answered, and informed consent                            was obtained. Prior Anticoagulants: The patient has                            taken no previous anticoagulant or antiplatelet                            agents. ASA Grade Assessment: II - A patient with                            mild systemic disease. After reviewing the risks                            and benefits, the patient was deemed in  satisfactory condition to undergo the procedure.                           After obtaining informed consent, the colonoscope                            was passed under direct vision. Throughout the                            procedure, the patient's blood pressure, pulse, and                            oxygen saturations were monitored continuously. The                            Colonoscope was introduced  through the anus and                            advanced to the the cecum, identified by                            appendiceal orifice and ileocecal valve. The                            colonoscopy was performed without difficulty. The                            patient tolerated the procedure well. The quality                            of the bowel preparation was good. The ileocecal                            valve, appendiceal orifice, and rectum were                            photographed. Scope In: 8:06:11 AM Scope Out: 8:34:23 AM Scope Withdrawal Time: 0 hours 24 minutes 1 second  Total Procedure Duration: 0 hours 28 minutes 12 seconds  Findings:                 The perianal and digital rectal examinations were                            normal.                           A diminutive polyp was found in the cecum. The                            polyp was sessile. The polyp was removed with a                            cold snare. Resection and retrieval were complete.  A 4 mm polyp was found in the transverse colon. The                            polyp was sessile. The polyp was removed with a                            cold snare. Resection was complete, but the polyp                            tissue was not retrieved.                           A few medium-mouthed diverticula were found in the                            transverse colon and ascending colon.                           Internal hemorrhoids were found during                            retroflexion. The hemorrhoids were small.                           The colon was quite tortuous.                           The exam was otherwise without abnormality. Complications:            No immediate complications. Estimated blood loss:                            Minimal. Estimated Blood Loss:     Estimated blood loss was minimal. Impression:               - One diminutive polyp in the cecum, removed  with a                            cold snare. Resected and retrieved.                           - One 4 mm polyp in the transverse colon, removed                            with a cold snare. Complete resection. Polyp tissue                            not retrieved.                           - Diverticulosis in the transverse colon and in the                            ascending colon.                           -  Internal hemorrhoids.                           - Tortuous colon.                           - The examination was otherwise normal. Recommendation:           - Patient has a contact number available for                            emergencies. The signs and symptoms of potential                            delayed complications were discussed with the                            patient. Return to normal activities tomorrow.                            Written discharge instructions were provided to the                            patient.                           - Resume previous diet.                           - Continue present medications.                           - Await pathology results.                           - Anticipate repeat colonoscopy in 5 years given                            burden of polyps on the last exam Nyzaiah Kai P. Oprah Camarena, MD 02/02/2021 8:41:11 AM This report has been signed electronically.

## 2021-02-02 NOTE — Patient Instructions (Signed)
1 polyp removed- await pathology Please read over handouts about polyps hemorrhoids and diverticulosos  Continue your normal medications  Next colonoscopy- 5 years but Dr. Havery Moros will let you know for sure after pathology comes back   YOU HAD AN ENDOSCOPIC PROCEDURE TODAY AT [facility]:   Refer to the procedure report that was given to you for any specific questions about what was found during the examination.  If the procedure report does not answer your questions, please call your gastroenterologist to clarify.  If you requested that your care partner not be given the details of your procedure findings, then the procedure report has been included in a sealed envelope for you to review at your convenience later.  YOU SHOULD EXPECT: Some feelings of bloating in the abdomen. Passage of more gas than usual.  Walking can help get rid of the air that was put into your GI tract during the procedure and reduce the bloating. If you had a lower endoscopy (such as a colonoscopy or flexible sigmoidoscopy) you may notice spotting of blood in your stool or on the toilet paper. If you underwent a bowel prep for your procedure, you may not have a normal bowel movement for a few days.  Please Note:  You might notice some irritation and congestion in your nose or some drainage.  This is from the oxygen used during your procedure.  There is no need for concern and it should clear up in a day or so.  SYMPTOMS TO REPORT IMMEDIATELY:  Following lower endoscopy (colonoscopy or flexible sigmoidoscopy):  Excessive amounts of blood in the stool  Significant tenderness or worsening of abdominal pains  Swelling of the abdomen that is new, acute  Fever of 100F or higher  For urgent or emergent issues, a gastroenterologist can be reached at any hour by calling [phone]. Do not use MyChart messaging for urgent concerns.    DIET:  We do recommend a small meal at first, but then you may  proceed to your regular diet.  Drink plenty of fluids but you should avoid alcoholic beverages for 24 hours.  ACTIVITY:  You should plan to take it easy for the rest of today and you should NOT DRIVE or use heavy machinery until tomorrow (because of the sedation medicines used during the test).    FOLLOW UP: Our staff will call the number listed on your records 48-72 hours following your procedure to check on you and address any questions or concerns that you may have regarding the information given to you following your procedure. If we do not reach you, we will leave a message.  We will attempt to reach you two times.  During this call, we will ask if you have developed any symptoms of COVID 19. If you develop any symptoms (ie: fever, flu-like symptoms, shortness of breath, cough etc.) before then, please call [phone].  If you test positive for Covid 19 in the 2 weeks post procedure, please call and report this information to [place].    If any biopsies were taken you will be contacted by phone or by letter within the next 1-3 weeks.  Please call us at [phone] if you have not heard about the biopsies in 3 weeks.    SIGNATURES/CONFIDENTIALITY: You and/or your care partner have signed paperwork which will be entered into your electronic medical record.  These signatures attest to the fact that that the information above on your After Visit Summary has been reviewed  and is understood.  Full responsibility of the confidentiality of this discharge information lies with you and/or your care-partner.

## 2021-02-02 NOTE — Progress Notes (Signed)
Pt's states no medical or surgical changes since previsit or office visit. 

## 2021-02-02 NOTE — Progress Notes (Signed)
To PACU, VSS. Report to [email or web address] 

## 2021-02-02 NOTE — Progress Notes (Signed)
[facility] History and Physical   Primary Care Physician:  Susy Frizzle, MD   Reason for Procedure:   History of colon polyps  Plan:    colonoscopy     HPI: Sergio Nelson is a 70 y.o. male  here for colonoscopy surveillance. 7 polyps removed in 01/2018 - most adenomas / SSPs. Patient denies any bowel symptoms at this time. No family history of colon cancer known in parents or siblings. Otherwise feels well without any cardiopulmonary symptoms or complaints   Past Medical History:  Diagnosis Date   Allergy    Arthritis    hands   Cataract    removed bilat 2021   GERD (gastroesophageal reflux disease)     Past Surgical History:  Procedure Laterality Date   CATARACT EXTRACTION Bilateral 2021   CHOLECYSTECTOMY N/A 10/17/2016   Procedure: LAPAROSCOPIC CHOLECYSTECTOMY WITH  INTRAOPERATIVE CHOLANGIOGRAM;  Surgeon: Jovita Kussmaul, MD;  Location: [facility];  Service: General;  Laterality: N/A;   COLONOSCOPY     NERVE TRANSFER     on left elbow (ulnaar neuropathy)   POLYPECTOMY      Prior to Admission medications   Medication Sig Start Date End Date Taking? Authorizing Provider  Misc Natural Products (NEURIVA PO) Take by mouth.   Yes [provider]  cetirizine (ZYRTEC) 10 MG tablet Take 10 mg by mouth daily. Patient not taking: Reported on 02/02/2021    [provider]  fexofenadine-pseudoephedrine (ALLEGRA-D) 60-120 MG 12 hr tablet Take 1 tablet by mouth 2 (two) times daily as needed (allergies).    [provider]    Current Outpatient Medications  Medication Sig Dispense Refill   Misc Natural Products (NEURIVA PO) Take by mouth.     cetirizine (ZYRTEC) 10 MG tablet Take 10 mg by mouth daily. (Patient not taking: Reported on 02/02/2021)     fexofenadine-pseudoephedrine (ALLEGRA-D) 60-120 MG 12 hr tablet Take 1 tablet by mouth 2 (two) times daily as needed (allergies).     Current Facility-Administered Medications  Medication Dose  Route Frequency Provider Last Rate Last Admin   0.9 %  sodium chloride infusion  500 mL Intravenous Once Jaden Batchelder, Carlota Raspberry, MD        Allergies as of 02/02/2021 - Review Complete 02/02/2021  Allergen Reaction Noted   Bextra [valdecoxib] Other (See Comments) 12/03/2017    Family History  Problem Relation Age of Onset   Hypertension Mother    Ulcers Mother    Diabetes Father    Heart disease Father    Colon polyps Father    Diabetes Paternal Aunt    Diabetes Paternal Uncle    Cancer Maternal Grandmother    Diabetes Paternal Grandmother    Cancer Paternal Grandmother    Diabetes Paternal Grandfather    Cancer Maternal Grandfather    Colon cancer Maternal Grandfather    Esophageal cancer Neg Hx    Rectal cancer Neg Hx    Stomach cancer Neg Hx     Social History   Socioeconomic History   Marital status: Married    Spouse name: Not on file   Number of children: 2   Years of education: 16   Highest education level: Not on file  Occupational History   Occupation: Chief Financial Officer  Tobacco Use   Smoking status: Never   Smokeless tobacco: Never  Vaping Use   Vaping Use: Never used  Substance and Sexual Activity   Alcohol use: Yes    Alcohol/week: 0.0 standard drinks  Comment: occasional   Drug use: No   Sexual activity: Yes  Other Topics Concern   Not on file  Social History Narrative   Fun: Travel, work on cars, go to [employer], and play golf.    Denies any religious beliefs that effect health care.    Social Determinants of Health   Financial Resource Strain: Not on file  Food Insecurity: Not on file  Transportation Needs: Not on file  Physical Activity: Not on file  Stress: Not on file  Social Connections: Not on file  Intimate Partner Violence: Not on file    Review of Systems: All other review of systems negative except as mentioned in the HPI.  Lab Results  Component Value Date   WBC 7.1 10/15/2020   HGB 17.1 10/15/2020   HCT 49.6 10/15/2020   MCV  96.1 10/15/2020   PLT 288 10/15/2020    Lab Results  Component Value Date   CREATININE 0.87 10/15/2020   BUN 12 10/15/2020   NA 145 10/15/2020   K 4.4 10/15/2020   CL 113 (H) 10/15/2020   CO2 23 10/15/2020    Lab Results  Component Value Date   ALT 13 10/15/2020   AST 18 10/15/2020   ALKPHOS 106 10/18/2016   BILITOT 1.2 10/15/2020     Physical Exam: Vital signs BP (!) 155/85   Pulse 65   Temp 98.9 F (37.2 C) (Skin)   Ht 5\' 8"  (1.727 m)   Wt 197 lb (89.4 kg)   SpO2 97%   BMI 29.95 kg/m   General:   Alert,  Well-developed, pleasant and cooperative in NAD Lungs:  Clear throughout to auscultation.   Heart:  Regular rate and rhythm Abdomen:  Soft, nontender and nondistended.   Neuro/Psych:  Alert and cooperative. Normal mood and affect. A and O x 3  Jolly Mango, MD [facility]

## 2021-02-04 ENCOUNTER — Telehealth: Payer: Self-pay

## 2021-02-04 NOTE — Telephone Encounter (Signed)
  Follow up Call-  Call back number 02/02/2021  Post procedure Call Back phone  # [phone]  Permission to leave phone message Yes  Some recent data might be hidden     Patient questions:  Do you have a fever, pain , or abdominal swelling? No. Pain Score  0 *  Have you tolerated food without any problems? Yes.    Have you been able to return to your normal activities? Yes.    Do you have any questions about your discharge instructions: Diet   No. Medications  No. Follow up visit  No.  Do you have questions or concerns about your Care? No.  Actions: * If pain score is 4 or above: No action needed, pain <4.  Have you developed a fever since your procedure? no  2.   Have you had an respiratory symptoms (SOB or cough) since your procedure? no  3.   Have you tested positive for COVID 19 since your procedure no  4.   Have you had any family members/close contacts diagnosed with the COVID 19 since your procedure?  no   If yes to any of these questions please route to Joylene John, RN and Joella Prince, RN

## 2021-02-09 DIAGNOSIS — Z23 Encounter for immunization: Secondary | ICD-10-CM | POA: Diagnosis not present

## 2021-03-31 DIAGNOSIS — H838X3 Other specified diseases of inner ear, bilateral: Secondary | ICD-10-CM | POA: Diagnosis not present

## 2021-03-31 DIAGNOSIS — H903 Sensorineural hearing loss, bilateral: Secondary | ICD-10-CM | POA: Diagnosis not present

## 2021-03-31 DIAGNOSIS — H9313 Tinnitus, bilateral: Secondary | ICD-10-CM | POA: Diagnosis not present

## 2021-05-23 ENCOUNTER — Other Ambulatory Visit: Payer: Self-pay | Admitting: Family Medicine

## 2021-05-24 DIAGNOSIS — Z961 Presence of intraocular lens: Secondary | ICD-10-CM | POA: Diagnosis not present

## 2021-12-05 ENCOUNTER — Ambulatory Visit (INDEPENDENT_AMBULATORY_CARE_PROVIDER_SITE_OTHER): Payer: Medicare Other | Admitting: Family Medicine

## 2021-12-05 VITALS — BP 120/72 | HR 73 | Temp 98.0°F | Ht 68.0 in | Wt 196.8 lb

## 2021-12-05 DIAGNOSIS — Z125 Encounter for screening for malignant neoplasm of prostate: Secondary | ICD-10-CM

## 2021-12-05 DIAGNOSIS — R350 Frequency of micturition: Secondary | ICD-10-CM | POA: Diagnosis not present

## 2021-12-05 DIAGNOSIS — Z13220 Encounter for screening for lipoid disorders: Secondary | ICD-10-CM

## 2021-12-05 DIAGNOSIS — R5383 Other fatigue: Secondary | ICD-10-CM | POA: Diagnosis not present

## 2021-12-05 DIAGNOSIS — R0781 Pleurodynia: Secondary | ICD-10-CM

## 2021-12-05 DIAGNOSIS — N401 Enlarged prostate with lower urinary tract symptoms: Secondary | ICD-10-CM | POA: Diagnosis not present

## 2021-12-05 DIAGNOSIS — Z0001 Encounter for general adult medical examination with abnormal findings: Secondary | ICD-10-CM

## 2021-12-05 DIAGNOSIS — Z0000 Encounter for general adult medical examination without abnormal findings: Secondary | ICD-10-CM

## 2021-12-05 NOTE — Patient Instructions (Signed)
Mr. Sergio Nelson , Thank you for taking time to come for your Medicare Wellness Visit. I appreciate your ongoing commitment to your health goals. Please review the following plan we discussed and let me know if I can assist you in the future.   Screening recommendations/referrals: Colonoscopy: Done 02/02/2021. Repeat every 5 years.  Recommended yearly ophthalmology/optometry visit for glaucoma screening and checkup Recommended yearly dental visit for hygiene and checkup  Vaccinations: Influenza vaccine: Done 12/20/2017 Repeat annually  Pneumococcal vaccine: Done 12/03/2017 and 10/21/2020.  Tdap vaccine: Done 07/15/2014 Repeat in 10 years  Shingles vaccine: Done 11/26/2018 and 06/27/2018.   Covid-19: Done 09/08/2020, 01/26/2020, 05/28/2019 and 05/07/2019  Advanced directives: Please bring a copy of your health care power of attorney and living will to the office to be added to your chart at your convenience.   Conditions/risks identified: Aim for 30 minutes of exercise or brisk walking, 6-8 glasses of water, and 5 servings of fruits and vegetables each day.   Next appointment: Follow up in one year for your annual wellness visit. 2024.  Preventive Care 69 Years and Older, Male  Preventive care refers to lifestyle choices and visits with your health care provider that can promote health and wellness. What does preventive care include? A yearly physical exam. This is also called an annual well check. Dental exams once or twice a year. Routine eye exams. Ask your health care provider how often you should have your eyes checked. Personal lifestyle choices, including: Daily care of your teeth and gums. Regular physical activity. Eating a healthy diet. Avoiding tobacco and drug use. Limiting alcohol use. Practicing safe sex. Taking low doses of aspirin every day. Taking vitamin and mineral supplements as recommended by your health care provider. What happens during an annual well check? The services  and screenings done by your health care provider during your annual well check will depend on your age, overall health, lifestyle risk factors, and family history of disease. Counseling  Your health care provider may ask you questions about your: Alcohol use. Tobacco use. Drug use. Emotional well-being. Home and relationship well-being. Sexual activity. Eating habits. History of falls. Memory and ability to understand (cognition). Work and work Statistician. Screening  You may have the following tests or measurements: Height, weight, and BMI. Blood pressure. Lipid and cholesterol levels. These may be checked every 5 years, or more frequently if you are over 69 years old. Skin check. Lung cancer screening. You may have this screening every year starting at age 69 if you have a 30-pack-year history of smoking and currently smoke or have quit within the past 15 years. Fecal occult blood test (FOBT) of the stool. You may have this test every year starting at age 69 Flexible sigmoidoscopy or colonoscopy. You may have a sigmoidoscopy every 5 years or a colonoscopy every 10 years starting at age 69 Prostate cancer screening. Recommendations will vary depending on your family history and other risks. Hepatitis C blood test. Hepatitis B blood test. Sexually transmitted disease (STD) testing. Diabetes screening. This is done by checking your blood sugar (glucose) after you have not eaten for a while (fasting). You may have this done every 1-3 years. Abdominal aortic aneurysm (AAA) screening. You may need this if you are a current or former smoker. Osteoporosis. You may be screened starting at age 69 if you are at high risk. Talk with your health care provider about your test results, treatment options, and if necessary, the need for more tests. Vaccines  Your  health care provider may recommend certain vaccines, such as: Influenza vaccine. This is recommended every year. Tetanus, diphtheria,  and acellular pertussis (Tdap, Td) vaccine. You may need a Td booster every 10 years. Zoster vaccine. You may need this after age 69 Pneumococcal 13-valent conjugate (PCV13) vaccine. One dose is recommended after age 69 Pneumococcal polysaccharide (PPSV23) vaccine. One dose is recommended after age 69 Talk to your health care provider about which screenings and vaccines you need and how often you need them. This information is not intended to replace advice given to you by your health care provider. Make sure you discuss any questions you have with your health care provider. Document Released: 04/30/2015 Document Revised: 12/22/2015 Document Reviewed: 02/02/2015 Elsevier Interactive Patient Education  2017 [employer] Prevention in the Home Falls can cause injuries. They can happen to people of all ages. There are many things you can do to make your home safe and to help prevent falls. What can I do on the outside of my home? Regularly fix the edges of walkways and driveways and fix any cracks. Remove anything that might make you trip as you walk through a door, such as a raised step or threshold. Trim any bushes or trees on the path to your home. Use bright outdoor lighting. Clear any walking paths of anything that might make someone trip, such as rocks or tools. Regularly check to see if handrails are loose or broken. Make sure that both sides of any steps have handrails. Any raised decks and porches should have guardrails on the edges. Have any leaves, snow, or ice cleared regularly. Use sand or salt on walking paths during winter. Clean up any spills in your garage right away. This includes oil or grease spills. What can I do in the bathroom? Use night lights. Install grab bars by the toilet and in the tub and shower. Do not use towel bars as grab bars. Use non-skid mats or decals in the tub or shower. If you need to sit down in the shower, use a plastic, non-slip  stool. Keep the floor dry. Clean up any water that spills on the floor as soon as it happens. Remove soap buildup in the tub or shower regularly. Attach bath mats securely with double-sided non-slip rug tape. Do not have throw rugs and other things on the floor that can make you trip. What can I do in the bedroom? Use night lights. Make sure that you have a light by your bed that is easy to reach. Do not use any sheets or blankets that are too big for your bed. They should not hang down onto the floor. Have a firm chair that has side arms. You can use this for support while you get dressed. Do not have throw rugs and other things on the floor that can make you trip. What can I do in the kitchen? Clean up any spills right away. Avoid walking on wet floors. Keep items that you use a lot in easy-to-reach places. If you need to reach something above you, use a strong step stool that has a grab bar. Keep electrical cords out of the way. Do not use floor polish or wax that makes floors slippery. If you must use wax, use non-skid floor wax. Do not have throw rugs and other things on the floor that can make you trip. What can I do with my stairs? Do not leave any items on the stairs. Make sure that there are handrails  on both sides of the stairs and use them. Fix handrails that are broken or loose. Make sure that handrails are as long as the stairways. Check any carpeting to make sure that it is firmly attached to the stairs. Fix any carpet that is loose or worn. Avoid having throw rugs at the top or bottom of the stairs. If you do have throw rugs, attach them to the floor with carpet tape. Make sure that you have a light switch at the top of the stairs and the bottom of the stairs. If you do not have them, ask someone to add them for you. What else can I do to help prevent falls? Wear shoes that: Do not have high heels. Have rubber bottoms. Are comfortable and fit you well. Are closed at the  toe. Do not wear sandals. If you use a stepladder: Make sure that it is fully opened. Do not climb a closed stepladder. Make sure that both sides of the stepladder are locked into place. Ask someone to hold it for you, if possible. Clearly mark and make sure that you can see: Any grab bars or handrails. First and last steps. Where the edge of each step is. Use tools that help you move around (mobility aids) if they are needed. These include: Canes. Walkers. Scooters. Crutches. Turn on the lights when you go into a dark area. Replace any light bulbs as soon as they burn out. Set up your furniture so you have a clear path. Avoid moving your furniture around. If any of your floors are uneven, fix them. If there are any pets around you, be aware of where they are. Review your medicines with your doctor. Some medicines can make you feel dizzy. This can increase your chance of falling. Ask your doctor what other things that you can do to help prevent falls. This information is not intended to replace advice given to you by your health care provider. Make sure you discuss any questions you have with your health care provider. Document Released: 01/28/2009 Document Revised: 09/09/2015 Document Reviewed: 05/08/2014 Elsevier Interactive Patient Education  2017 [employer].
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
## 2021-12-06 ENCOUNTER — Other Ambulatory Visit: Payer: Medicare Other

## 2021-12-06 DIAGNOSIS — R350 Frequency of micturition: Secondary | ICD-10-CM | POA: Diagnosis not present

## 2021-12-06 DIAGNOSIS — N401 Enlarged prostate with lower urinary tract symptoms: Secondary | ICD-10-CM | POA: Diagnosis not present

## 2021-12-06 DIAGNOSIS — E785 Hyperlipidemia, unspecified: Secondary | ICD-10-CM | POA: Diagnosis not present

## 2021-12-06 DIAGNOSIS — Z0000 Encounter for general adult medical examination without abnormal findings: Secondary | ICD-10-CM | POA: Diagnosis not present

## 2021-12-06 DIAGNOSIS — Z125 Encounter for screening for malignant neoplasm of prostate: Secondary | ICD-10-CM | POA: Diagnosis not present

## 2021-12-06 DIAGNOSIS — Z13220 Encounter for screening for lipoid disorders: Secondary | ICD-10-CM | POA: Diagnosis not present

## 2021-12-06 DIAGNOSIS — R5383 Other fatigue: Secondary | ICD-10-CM | POA: Diagnosis not present

## 2021-12-07 LAB — COMPLETE METABOLIC PANEL WITH GFR
AG Ratio: 1.7 (calc) (ref 1.0–2.5)
ALT: 13 U/L (ref 9–46)
AST: 16 U/L (ref 10–35)
Albumin: 3.7 g/dL (ref 3.6–5.1)
Alkaline phosphatase (APISO): 54 U/L (ref 35–144)
BUN: 14 mg/dL (ref 7–25)
CO2: 23 mmol/L (ref 20–32)
Calcium: 9.1 mg/dL (ref 8.6–10.3)
Chloride: 113 mmol/L — ABNORMAL HIGH (ref 98–110)
Creat: 0.94 mg/dL (ref 0.70–1.35)
Globulin: 2.2 g/dL (calc) (ref 1.9–3.7)
Glucose, Bld: 95 mg/dL (ref 65–99)
Potassium: 4.6 mmol/L (ref 3.5–5.3)
Sodium: 146 mmol/L (ref 135–146)
Total Bilirubin: 1 mg/dL (ref 0.2–1.2)
Total Protein: 5.9 g/dL — ABNORMAL LOW (ref 6.1–8.1)
eGFR: 88 mL/min/{1.73_m2} (ref >=60)

## 2021-12-07 LAB — LIPID PANEL
Cholesterol: 148 mg/dL (ref <200)
HDL: 49 mg/dL (ref >=40)
LDL Cholesterol (Calc): 86 mg/dL (calc)
Non-HDL Cholesterol (Calc): 99 mg/dL (calc) (ref <130)
Total CHOL/HDL Ratio: 3 (calc) (ref <5.0)
Triglycerides: 51 mg/dL (ref <150)

## 2021-12-07 LAB — CBC WITH DIFFERENTIAL/PLATELET
Absolute Monocytes: 1030 cells/uL — ABNORMAL HIGH (ref 200–950)
Basophils Absolute: 109 cells/uL (ref 0–200)
Basophils Relative: 1.4 %
Eosinophils Absolute: 429 cells/uL (ref 15–500)
Eosinophils Relative: 5.5 %
HCT: 47.5 % (ref 38.5–50.0)
Hemoglobin: 17.1 g/dL (ref 13.2–17.1)
Lymphs Abs: 2660 cells/uL (ref 850–3900)
MCH: 33.9 pg — ABNORMAL HIGH (ref 27.0–33.0)
MCHC: 36 g/dL (ref 32.0–36.0)
MCV: 94.2 fL (ref 80.0–100.0)
MPV: 9.3 fL (ref 7.5–12.5)
Monocytes Relative: 13.2 %
Neutro Abs: 3572 cells/uL (ref 1500–7800)
Neutrophils Relative %: 45.8 %
Platelets: 266 10*3/uL (ref 140–400)
RBC: 5.04 10*6/uL (ref 4.20–5.80)
RDW: 12.5 % (ref 11.0–15.0)
Total Lymphocyte: 34.1 %
WBC: 7.8 10*3/uL (ref 3.8–10.8)

## 2021-12-07 LAB — PSA: PSA: 0.75 ng/mL (ref <=4.00)

## 2022-01-10 DIAGNOSIS — Z23 Encounter for immunization: Secondary | ICD-10-CM | POA: Diagnosis not present

## 2022-01-11 ENCOUNTER — Ambulatory Visit (INDEPENDENT_AMBULATORY_CARE_PROVIDER_SITE_OTHER): Payer: Medicare Other | Admitting: Family Medicine

## 2022-01-11 ENCOUNTER — Other Ambulatory Visit: Payer: Self-pay | Admitting: Family Medicine

## 2022-01-11 ENCOUNTER — Encounter: Payer: Self-pay | Admitting: Family Medicine

## 2022-01-11 VITALS — BP 140/82 | HR 76 | Temp 97.7°F | Ht 68.0 in | Wt 168.0 lb

## 2022-01-11 DIAGNOSIS — T63441A Toxic effect of venom of bees, accidental (unintentional), initial encounter: Secondary | ICD-10-CM

## 2022-01-11 MED ORDER — EPINEPHRINE 0.3 MG/0.3ML IJ SOAJ: 0.3000 mg | INTRAMUSCULAR | 3 refills | Status: AC | PRN

## 2022-01-11 NOTE — Progress Notes (Signed)
Acute Office Visit  Subjective:     Patient ID: Sergio Nelson, male    DOB: 07-20-1950, 71 y.o.   MRN: [id number]  Chief Complaint  Patient presents with   Follow-up    Had about 10 bee stings a week ago (10 days) one of the sting that may be infected  Per pt is allergic to Bee Sting    10 days ago he got stung by 8-10 yellow jackets. He has a bee sting allergy but has not been stung in 30 years. Symptoms included site swelling, itching, chest tightness, shortness of breath, and cold sweats within minutes of stings and worsened over 15-20 minutes, relieved by Benadryl over the next hour. No ongoing symptoms other than swelling and itching at the site on his posterior neck. Has taken Benadryl yesterday.   Past Medical History:  Diagnosis Date   Allergy    Arthritis    hands   Cataract    removed bilat 2021   GERD (gastroesophageal reflux disease)    Past Surgical History:  Procedure Laterality Date   CATARACT EXTRACTION Bilateral 2021   CHOLECYSTECTOMY N/A 10/17/2016   Procedure: LAPAROSCOPIC CHOLECYSTECTOMY WITH  INTRAOPERATIVE CHOLANGIOGRAM;  Surgeon: Autumn Messing III, MD;  Location: [facility];  Service: General;  Laterality: N/A;   COLONOSCOPY     NERVE TRANSFER     on left elbow (ulnaar neuropathy)   POLYPECTOMY     Current Outpatient Medications on File Prior to Visit  Medication Sig Dispense Refill   cetirizine (ZYRTEC) 10 MG tablet Take 10 mg by mouth daily.     fexofenadine-pseudoephedrine (ALLEGRA-D) 60-120 MG 12 hr tablet Take 1 tablet by mouth 2 (two) times daily as needed (allergies).     Misc Natural Products (NEURIVA PO) Take by mouth.     No current facility-administered medications on file prior to visit.   Allergies  Allergen Reactions   Bee Venom Hives   Bextra [Valdecoxib] Other (See Comments)    Flu symptoms     Review of Systems  Constitutional: Negative.   Respiratory: Negative.    Cardiovascular: Negative.   Skin:        See HPI         Objective:    BP (!) 140/82   Pulse 76   Temp 97.7 F (36.5 C) (Oral)   Ht '5\' 8"'$  (1.727 m)   Wt 168 lb (76.2 kg)   SpO2 96%   BMI 25.54 kg/m    Physical Exam Vitals and nursing note reviewed.  Constitutional:      Appearance: Normal appearance.  Neck:   Musculoskeletal:     Cervical back: Normal range of motion. Edema and erythema present.  Neurological:     Mental Status: He is alert.     No results found for any visits on 01/11/22.      Assessment & Plan:  Bee sting reaction, accidental or unintentional, initial encounter - Plan: EPINEPHrine 0.3 mg/0.3 mL IJ SOAJ injection [name] bee sting reaction was severe but has since resolved. He still has some minor redness and swelling at posterior neck site that is improving with benadryl cream. I will order Epinephrine pens for subsequent encounters.    Meds ordered this encounter  Medications   EPINEPHrine 0.3 mg/0.3 mL IJ SOAJ injection    Sig: Inject 0.3 mg into the muscle as needed for anaphylaxis.    Dispense:  1 each    Refill:  3    Order Specific Question:  Supervising Provider    Answer:   Jenna Luo T [[id number]]    Return if symptoms worsen or fail to improve.  Rubie Maid, FNP

## 2022-05-05 DIAGNOSIS — H838X3 Other specified diseases of inner ear, bilateral: Secondary | ICD-10-CM | POA: Diagnosis not present

## 2022-05-05 DIAGNOSIS — H903 Sensorineural hearing loss, bilateral: Secondary | ICD-10-CM | POA: Diagnosis not present

## 2022-05-05 DIAGNOSIS — H9313 Tinnitus, bilateral: Secondary | ICD-10-CM | POA: Diagnosis not present

## 2022-09-18 DIAGNOSIS — H35372 Puckering of macula, left eye: Secondary | ICD-10-CM | POA: Diagnosis not present

## 2022-09-18 DIAGNOSIS — H524 Presbyopia: Secondary | ICD-10-CM | POA: Diagnosis not present

## 2022-09-18 DIAGNOSIS — H52203 Unspecified astigmatism, bilateral: Secondary | ICD-10-CM | POA: Diagnosis not present

## 2022-09-18 DIAGNOSIS — H26493 Other secondary cataract, bilateral: Secondary | ICD-10-CM | POA: Diagnosis not present

## 2022-09-18 DIAGNOSIS — Z961 Presence of intraocular lens: Secondary | ICD-10-CM | POA: Diagnosis not present

## 2022-10-05 DIAGNOSIS — H26493 Other secondary cataract, bilateral: Secondary | ICD-10-CM | POA: Diagnosis not present

## 2022-10-30 DIAGNOSIS — D1801 Hemangioma of skin and subcutaneous tissue: Secondary | ICD-10-CM | POA: Diagnosis not present

## 2022-10-30 DIAGNOSIS — D485 Neoplasm of uncertain behavior of skin: Secondary | ICD-10-CM | POA: Diagnosis not present

## 2022-10-30 DIAGNOSIS — L814 Other melanin hyperpigmentation: Secondary | ICD-10-CM | POA: Diagnosis not present

## 2022-10-30 DIAGNOSIS — D225 Melanocytic nevi of trunk: Secondary | ICD-10-CM | POA: Diagnosis not present

## 2022-10-30 DIAGNOSIS — D0439 Carcinoma in situ of skin of other parts of face: Secondary | ICD-10-CM | POA: Diagnosis not present

## 2022-10-30 DIAGNOSIS — L570 Actinic keratosis: Secondary | ICD-10-CM | POA: Diagnosis not present

## 2022-10-30 DIAGNOSIS — L821 Other seborrheic keratosis: Secondary | ICD-10-CM | POA: Diagnosis not present

## 2022-11-22 ENCOUNTER — Other Ambulatory Visit: Payer: Medicare Other

## 2022-11-22 DIAGNOSIS — E785 Hyperlipidemia, unspecified: Secondary | ICD-10-CM | POA: Diagnosis not present

## 2022-11-22 DIAGNOSIS — Z125 Encounter for screening for malignant neoplasm of prostate: Secondary | ICD-10-CM

## 2022-11-22 DIAGNOSIS — R3915 Urgency of urination: Secondary | ICD-10-CM | POA: Diagnosis not present

## 2022-11-22 DIAGNOSIS — R5383 Other fatigue: Secondary | ICD-10-CM | POA: Diagnosis not present

## 2022-11-22 DIAGNOSIS — Z13220 Encounter for screening for lipoid disorders: Secondary | ICD-10-CM

## 2022-11-22 DIAGNOSIS — R030 Elevated blood-pressure reading, without diagnosis of hypertension: Secondary | ICD-10-CM | POA: Diagnosis not present

## 2022-11-22 LAB — CBC WITH DIFFERENTIAL/PLATELET
Absolute Monocytes: 1013 cells/uL — ABNORMAL HIGH (ref 200–950)
Basophils Absolute: 138 cells/uL (ref 0–200)
Basophils Relative: 1.7 %
Eosinophils Absolute: 551 cells/uL — ABNORMAL HIGH (ref 15–500)
Eosinophils Relative: 6.8 %
HCT: 47.7 % (ref 38.5–50.0)
Hemoglobin: 16.6 g/dL (ref 13.2–17.1)
Lymphs Abs: 2900 cells/uL (ref 850–3900)
MCH: 33.3 pg — ABNORMAL HIGH (ref 27.0–33.0)
MCHC: 34.8 g/dL (ref 32.0–36.0)
MCV: 95.6 fL (ref 80.0–100.0)
MPV: 10.7 fL (ref 7.5–12.5)
Monocytes Relative: 12.5 %
Neutro Abs: 3499 cells/uL (ref 1500–7800)
Neutrophils Relative %: 43.2 %
Platelets: 265 10*3/uL (ref 140–400)
RBC: 4.99 10*6/uL (ref 4.20–5.80)
RDW: 12.7 % (ref 11.0–15.0)
Total Lymphocyte: 35.8 %
WBC: 8.1 10*3/uL (ref 3.8–10.8)

## 2022-11-22 LAB — COMPLETE METABOLIC PANEL WITH GFR
AG Ratio: 1.8 (calc) (ref 1.0–2.5)
ALT: 16 U/L (ref 9–46)
AST: 19 U/L (ref 10–35)
Albumin: 3.7 g/dL (ref 3.6–5.1)
Alkaline phosphatase (APISO): 52 U/L (ref 35–144)
BUN: 12 mg/dL (ref 7–25)
CO2: 25 mmol/L (ref 20–32)
Calcium: 8.9 mg/dL (ref 8.6–10.3)
Chloride: 111 mmol/L — ABNORMAL HIGH (ref 98–110)
Creat: 0.91 mg/dL (ref 0.70–1.28)
Globulin: 2.1 g/dL (calc) (ref 1.9–3.7)
Glucose, Bld: 90 mg/dL (ref 65–99)
Potassium: 4.6 mmol/L (ref 3.5–5.3)
Sodium: 143 mmol/L (ref 135–146)
Total Bilirubin: 0.9 mg/dL (ref 0.2–1.2)
Total Protein: 5.8 g/dL — ABNORMAL LOW (ref 6.1–8.1)
eGFR: 91 mL/min/{1.73_m2} (ref >=60)

## 2022-11-22 LAB — LIPID PANEL
Cholesterol: 134 mg/dL (ref <200)
HDL: 46 mg/dL (ref >=40)
LDL Cholesterol (Calc): 75 mg/dL (calc)
Non-HDL Cholesterol (Calc): 88 mg/dL (calc) (ref <130)
Total CHOL/HDL Ratio: 2.9 (calc) (ref <5.0)
Triglycerides: 44 mg/dL (ref <150)

## 2022-12-07 ENCOUNTER — Ambulatory Visit (INDEPENDENT_AMBULATORY_CARE_PROVIDER_SITE_OTHER): Payer: Medicare Other | Admitting: Family Medicine

## 2022-12-07 VITALS — BP 138/76 | HR 72 | Temp 97.8°F | Ht 68.0 in | Wt 204.6 lb

## 2022-12-07 DIAGNOSIS — Z125 Encounter for screening for malignant neoplasm of prostate: Secondary | ICD-10-CM

## 2022-12-07 DIAGNOSIS — Z0000 Encounter for general adult medical examination without abnormal findings: Secondary | ICD-10-CM | POA: Diagnosis not present

## 2022-12-07 DIAGNOSIS — Z13220 Encounter for screening for lipoid disorders: Secondary | ICD-10-CM

## 2022-12-07 NOTE — Progress Notes (Signed)
Subjective:    Patient ID: Sergio Nelson, male    DOB: [birth year], 72 y.o.   MRN: [id number]  HPI  Patient is a very pleasant 72--year-old Caucasian male who is here today for CPE.  Patient actually is my godfather whom I have known my entire life.  Had colonoscopy 10/22 which showed 2 polyps and Dr. Adela Lank recommended repeating in 5 years.  His vaccines are up to date except for a flu shot.  Specifically he denies any chest pain shortness of breath dyspnea on exertion.  Most recent lab work is listed below. Immunization History  Administered Date(s) Administered   Influenza, High Dose Seasonal PF 12/20/2017   Influenza-Unspecified 12/20/2017   PFIZER(Purple Top)SARS-COV-2 Vaccination 05/07/2019, 05/28/2019, 01/26/2020, 09/08/2020   Pneumococcal Conjugate-13 12/03/2017   Pneumococcal Polysaccharide-23 10/21/2020   Tdap 07/15/2014   Zoster Recombinant(Shingrix) 06/27/2018, 11/26/2018   Lab on 11/22/2022  Component Date Value Ref Range Status   WBC 11/22/2022 8.1  3.8 - 10.8 Thousand/uL Final   RBC 11/22/2022 4.99  4.20 - 5.80 Million/uL Final   Hemoglobin 11/22/2022 16.6  13.2 - 17.1 g/dL Final   HCT 32/44/0102 47.7  38.5 - 50.0 % Final   MCV 11/22/2022 95.6  80.0 - 100.0 fL Final   MCH 11/22/2022 33.3 (H)  27.0 - 33.0 pg Final   MCHC 11/22/2022 34.8  32.0 - 36.0 g/dL Final   RDW 72/53/6644 12.7  11.0 - 15.0 % Final   Platelets 11/22/2022 265  140 - 400 Thousand/uL Final   MPV 11/22/2022 10.7  7.5 - 12.5 fL Final   Neutro Abs 11/22/2022 3,499  1,500 - 7,800 cells/uL Final   Lymphs Abs 11/22/2022 2,900  850 - 3,900 cells/uL Final   Absolute Monocytes 11/22/2022 1,013 (H)  200 - 950 cells/uL Final   Eosinophils Absolute 11/22/2022 551 (H)  15 - 500 cells/uL Final   Basophils Absolute 11/22/2022 138  0 - 200 cells/uL Final   Neutrophils Relative % 11/22/2022 43.2  % Final   Total Lymphocyte 11/22/2022 35.8  % Final   Monocytes Relative 11/22/2022 12.5  % Final   Eosinophils  Relative 11/22/2022 6.8  % Final   Basophils Relative 11/22/2022 1.7  % Final   Glucose, Bld 11/22/2022 90  65 - 99 mg/dL Final   Comment: .            Fasting reference interval .    BUN 11/22/2022 12  7 - 25 mg/dL Final   Creat 03/47/4259 0.91  0.70 - 1.28 mg/dL Final   eGFR 56/38/7564 91  > OR = 60 mL/min/1.37m2 Final   BUN/Creatinine Ratio 11/22/2022 SEE NOTE:  6 - 22 (calc) Final   Comment:    Not Reported: BUN and Creatinine are within    reference range. .    Sodium 11/22/2022 143  135 - 146 mmol/L Final   Potassium 11/22/2022 4.6  3.5 - 5.3 mmol/L Final   Chloride 11/22/2022 111 (H)  98 - 110 mmol/L Final   Comment: Verified by repeat analysis. .    CO2 11/22/2022 25  20 - 32 mmol/L Final   Calcium 11/22/2022 8.9  8.6 - 10.3 mg/dL Final   Total Protein 33/29/5188 5.8 (L)  6.1 - 8.1 g/dL Final   Albumin 41/66/0630 3.7  3.6 - 5.1 g/dL Final   Globulin 16/04/930 2.1  1.9 - 3.7 g/dL (calc) Final   AG Ratio 11/22/2022 1.8  1.0 - 2.5 (calc) Final   Total Bilirubin 11/22/2022 0.9  0.2 -  1.2 mg/dL Final   Alkaline phosphatase (APISO) 11/22/2022 52  35 - 144 U/L Final   AST 11/22/2022 19  10 - 35 U/L Final   ALT 11/22/2022 16  9 - 46 U/L Final   Cholesterol 11/22/2022 134  <200 mg/dL Final   HDL 47/42/5956 46  > OR = 40 mg/dL Final   Triglycerides 38/75/6433 44  <150 mg/dL Final   LDL Cholesterol (Calc) 11/22/2022 75  mg/dL (calc) Final   Comment: Reference range: <100 . Desirable range <100 mg/dL for primary prevention;   <70 mg/dL for patients with CHD or diabetic patients  with > or = 2 CHD risk factors. Marland Kitchen LDL-C is now calculated using the Martin-Hopkins  calculation, which is a validated novel method providing  better accuracy than the Friedewald equation in the  estimation of LDL-C.  Horald Pollen et al. Lenox Ahr. 2951;884(16): 2061-2068  (http://education.[facility].com/faq/FAQ164)    Total CHOL/HDL Ratio 11/22/2022 2.9  <6.0 (calc) Final   Non-HDL Cholesterol  (Calc) 11/22/2022 88  <130 mg/dL (calc) Final   Comment: For patients with diabetes plus 1 major ASCVD risk  factor, treating to a non-HDL-C goal of <100 mg/dL  (LDL-C of <63 mg/dL) is considered a therapeutic  option.    PSA 11/22/2022 0.50  < OR = 4.00 ng/mL Final   Comment: The total PSA value from this assay system is  standardized against the WHO standard. The test  result will be approximately 20% lower when compared  to the equimolar-standardized total PSA (Beckman  Coulter). Comparison of serial PSA results should be  interpreted with this fact in mind. . This test was performed using the Siemens  chemiluminescent method. Values obtained from  different assay methods cannot be used interchangeably. PSA levels, regardless of value, should not be interpreted as absolute evidence of the presence or absence of disease.     Past Medical History:  Diagnosis Date   Allergy    Arthritis    hands   Cataract    removed bilat 2021   GERD (gastroesophageal reflux disease)    Past Surgical History:  Procedure Laterality Date   CATARACT EXTRACTION Bilateral 2021   CHOLECYSTECTOMY N/A 10/17/2016   Procedure: LAPAROSCOPIC CHOLECYSTECTOMY WITH  INTRAOPERATIVE CHOLANGIOGRAM;  Surgeon: Chevis Pretty III, MD;  Location: [facility];  Service: General;  Laterality: N/A;   COLONOSCOPY     NERVE TRANSFER     on left elbow (ulnaar neuropathy)   POLYPECTOMY     Current Outpatient Medications on File Prior to Visit  Medication Sig Dispense Refill   EPINEPHrine 0.3 mg/0.3 mL IJ SOAJ injection Inject 0.3 mg into the muscle as needed for anaphylaxis. 1 each 3   fexofenadine-pseudoephedrine (ALLEGRA-D) 60-120 MG 12 hr tablet Take 1 tablet by mouth 2 (two) times daily as needed (allergies).     Misc Natural Products (NEURIVA PO) Take by mouth.     cetirizine (ZYRTEC) 10 MG tablet Take 10 mg by mouth daily. (Patient not taking: Reported on 12/07/2022)     No current facility-administered medications  on file prior to visit.   Allergies  Allergen Reactions   Bee Venom Hives   Bextra [Valdecoxib] Other (See Comments)    Flu symptoms    Other Itching   Social History   Socioeconomic History   Marital status: Married    Spouse name: Not on file   Number of children: 2   Years of education: 16   Highest education level: Not on file  Occupational History  Occupation: Art gallery manager  Tobacco Use   Smoking status: Never   Smokeless tobacco: Never  Vaping Use   Vaping status: Never Used  Substance and Sexual Activity   Alcohol use: Yes    Alcohol/week: 0.0 standard drinks of alcohol    Comment: occasional   Drug use: No   Sexual activity: Yes  Other Topics Concern   Not on file  Social History Narrative   Fun: Travel, work on cars, go to [employer], and play golf.    Denies any religious beliefs that effect health care.    Social Determinants of Health   Financial Resource Strain: Not on file  Food Insecurity: Not on file  Transportation Needs: Not on file  Physical Activity: Not on file  Stress: Not on file  Social Connections: Not on file  Intimate Partner Violence: Not on file   Family History  Problem Relation Age of Onset   Hypertension Mother    Ulcers Mother    Diabetes Father    Heart disease Father    Colon polyps Father    Diabetes Paternal Aunt    Diabetes Paternal Uncle    Cancer Maternal Grandmother    Diabetes Paternal Grandmother    Cancer Paternal Grandmother    Diabetes Paternal Grandfather    Cancer Maternal Grandfather    Colon cancer Maternal Grandfather    Esophageal cancer Neg Hx    Rectal cancer Neg Hx    Stomach cancer Neg Hx      Review of Systems  All other systems reviewed and are negative.      Objective:   Physical Exam Vitals reviewed.  Constitutional:      General: He is not in acute distress.    Appearance: Normal appearance. He is well-developed and normal weight. He is not ill-appearing, toxic-appearing or  diaphoretic.  HENT:     Head: Normocephalic and atraumatic.     Right Ear: Tympanic membrane, ear canal and external ear normal.     Left Ear: Tympanic membrane, ear canal and external ear normal.     Nose: No congestion or rhinorrhea.     Mouth/Throat:     Mouth: Mucous membranes are moist.     Pharynx: Oropharynx is clear. No oropharyngeal exudate or posterior oropharyngeal erythema.  Eyes:     General: No scleral icterus.       Right eye: No discharge.        Left eye: No discharge.     Conjunctiva/sclera: Conjunctivae normal.     Pupils: Pupils are equal, round, and reactive to light.  Neck:     Thyroid: No thyromegaly.     Vascular: No JVD.     Trachea: No tracheal deviation.  Cardiovascular:     Rate and Rhythm: Normal rate and regular rhythm.     Heart sounds: Normal heart sounds. No murmur heard.    No friction rub. No gallop.  Pulmonary:     Effort: Pulmonary effort is normal. No respiratory distress.     Breath sounds: Normal breath sounds. No stridor. No wheezing, rhonchi or rales.  Chest:     Chest wall: No tenderness.  Abdominal:     General: Bowel sounds are normal. There is no distension.     Palpations: Abdomen is soft. There is no mass.     Tenderness: There is no abdominal tenderness. There is no guarding or rebound.     Hernia: No hernia is present.  Musculoskeletal:  General: No tenderness or deformity. Normal range of motion.     Cervical back: Normal range of motion and neck supple.     Left lower leg: No edema.  Lymphadenopathy:     Cervical: No cervical adenopathy.  Skin:    General: Skin is warm.     Coloration: Skin is not pale.     Findings: No erythema or rash.  Neurological:     General: No focal deficit present.     Mental Status: He is alert and oriented to person, place, and time. Mental status is at baseline.     Cranial Nerves: No cranial nerve deficit.     Sensory: No sensory deficit.     Motor: No weakness or abnormal muscle  tone.     Coordination: Coordination normal.     Gait: Gait normal.     Deep Tendon Reflexes: Reflexes normal.  Psychiatric:        Mood and Affect: Mood normal.        Behavior: Behavior normal.        Thought Content: Thought content normal.        Judgment: Judgment normal.           Assessment & Plan:  Routine general medical examination at a health care facility  Screening cholesterol level  Screening for prostate cancer Lab work is outstanding.  Physical exam today is normal except for borderline hypertension.  Patient's weight is up from his baseline.  He is working on diet and exercise discussed this.  Home can screeningfor screening up-to-date.  Immunizations are up-to-date.  Regular anticipatory guidance is provided.  Review of systems is negative.  Patient denies any falls or depression or memory loss

## 2023-01-25 DIAGNOSIS — Z23 Encounter for immunization: Secondary | ICD-10-CM | POA: Diagnosis not present

## 2023-03-26 ENCOUNTER — Ambulatory Visit
Admission: EM | Admit: 2023-03-26 | Discharge: 2023-03-26 | Disposition: A | Discharge status: Home / Self Care | Payer: Medicare Other | Attending: Family Medicine | Admitting: Family Medicine

## 2023-03-26 ENCOUNTER — Ambulatory Visit: Payer: Medicare Other

## 2023-03-26 ENCOUNTER — Ambulatory Visit: Payer: Self-pay | Admitting: Family Medicine

## 2023-03-26 DIAGNOSIS — W19XXXA Unspecified fall, initial encounter: Secondary | ICD-10-CM

## 2023-03-26 DIAGNOSIS — S161XXA Strain of muscle, fascia and tendon at neck level, initial encounter: Secondary | ICD-10-CM | POA: Diagnosis not present

## 2023-03-26 DIAGNOSIS — M545 Low back pain, unspecified: Secondary | ICD-10-CM

## 2023-03-26 DIAGNOSIS — M47816 Spondylosis without myelopathy or radiculopathy, lumbar region: Secondary | ICD-10-CM | POA: Diagnosis not present

## 2023-03-26 DIAGNOSIS — M542 Cervicalgia: Secondary | ICD-10-CM | POA: Diagnosis not present

## 2023-03-26 DIAGNOSIS — S3992XA Unspecified injury of lower back, initial encounter: Secondary | ICD-10-CM | POA: Diagnosis not present

## 2023-03-26 DIAGNOSIS — M4802 Spinal stenosis, cervical region: Secondary | ICD-10-CM | POA: Diagnosis not present

## 2023-03-26 MED ORDER — CYCLOBENZAPRINE HCL 5 MG PO TABS: 5.0000 mg | ORAL_TABLET | Freq: Three times a day (TID) | ORAL | 0 refills | Status: DC | PRN

## 2023-03-26 NOTE — ED Triage Notes (Signed)
Pt states he fell last night off 10 foot ladder and now having pain in lower back and tail bone pain. Taking flexeril and ibuprofen and helped with the pain.

## 2023-03-26 NOTE — Telephone Encounter (Signed)
Copied from CRM [id number]. Topic: Clinical - Red Word Triage >> Mar 26, 2023 10:58 AM Cassiday T wrote: Red Word that prompted transfer to Nurse Triage: patient fell off a ten ft ladder and fell on concrete his back is hurting   Chief Complaint: back pain due to fall Symptoms: 7-8/10 pain Frequency: relieved by otc pain medicine and wife's muscle relaxant  Pertinent Negatives: Patient denies bruising, numbness or weakness in extremities  Disposition: [] ED /[] Urgent Care (no appt availability in office) / [x] Appointment(In office/virtual)/ []  [facility]/ [] Home Care/ [] Refused Recommended Disposition /[] [facility]/ []  Follow-up with PCP Additional Notes:  The patient fell off a ladder onto a concrete floor yesterday.  He stated that he somewhat caught himself but his tailbone feels sore and his lower back is hurting today.  The patient's wife had Cyclobenzaprine and he took a dose last night that was helpful for the pain.  The patient is using a family member's cane to walk due to the 7-8/10 pain.  He denied numbness or weakness in his extremities.    He has been taking over the counter ibuprofen that is somewhat helpful.  The patient specifically prefers to see Dr. Tanya Nones for follow up.  He is requesting a pain reliever or muscle relaxant.  He has ongoing neck pain due to an unrelated event, sleeping without a headrest on a plane, and wanted his hcp to be made aware.  Please advise patient of next steps. Reason for Disposition  [1] MODERATE pain (e.g., interferes with normal activities) AND [2] high-risk adult (e.g., age > 60 years, osteoporosis, chronic steroid use)  Answer Assessment - Initial Assessment Questions 1. MECHANISM: "How did the injury happen?" (Consider the possibility of domestic violence or elder abuse)     Larey Seat off ladder onto concrete floor  2. ONSET: "When did the injury happen?" (Minutes or hours ago)     03/25/2023 3. LOCATION: "What part of the back  is injured?"     Lower back and sore tailbone  4. SEVERITY: "Can you move the back normally?"     Hurts when bending over or sit down; feels like a muscle spasm 5. PAIN: "Is there any pain?" If Yes, ask: "How bad is the pain?"   (Scale 1-10; or mild, moderate, severe)     Needing to walk with a cane due to pain 7-8/10 6. CORD SYMPTOMS: Any weakness or numbness of the arms or legs?"     none 7. SIZE: For cuts, bruises, or swelling, ask: "How large is it?" (e.g., inches or centimeters)     No bruising noted 9. OTHER SYMPTOMS: "Do you have any other symptoms?" (e.g., abdomen pain, blood in urine)     Ice and ibuprofen heat on site; meloxicam on hand; took a muscle relaxant  Neck pain prior to fall - we went on a long vacation in November and we were on a plane for several hours and I didn't have a neck pillow and my head was drooping down and when I turn my head left and right I'm getting a popping sound  Protocols used: Back Injury-A-AH

## 2023-03-26 NOTE — Discharge Instructions (Signed)
We will call if anything comes back abnormal on your x-rays today.  I have sent over a muscle relaxer and you may use over-the-counter pain relievers, heat, massage, lidocaine patches.  Follow-up as scheduled with primary care

## 2023-03-27 NOTE — ED Provider Notes (Signed)
[facility]    CSN: [id number] Arrival date & time: 03/26/23  1621      History   Chief Complaint Chief Complaint  Patient presents with   Tailbone Pain    HPI Sergio Nelson is a 72 y.o. male.   Patient presenting today with midline low back/tailbone pain since falling off of a ladder earlier today.  States he stumbled and ultimately fell on his backside.  Denies loss of range of motion, numbness, tingling, bowel or bladder incontinence, saddle anesthesias, bruising, swelling, head injury, loss of consciousness.  He is trying Flexeril and ibuprofen with mild temporary benefit.  He also complains of about 8 weeks of right-sided neck strain since sleeping wrong, denies loss of range of motion, radiation of pain.    Past Medical History:  Diagnosis Date   Allergy    Arthritis    hands   Cataract    removed bilat 2021   GERD (gastroesophageal reflux disease)     Patient Active Problem List   Diagnosis Date Noted   Seasonal allergies 10/22/2017   Elevated blood pressure reading without diagnosis of hypertension 10/15/2016   Cholecystitis 10/15/2016   RUQ pain 10/12/2016   Chest pain 10/12/2016   Hyperlipidemia 10/12/2016   Routine general medical examination at a health care facility 07/15/2014    Past Surgical History:  Procedure Laterality Date   CATARACT EXTRACTION Bilateral 2021   CHOLECYSTECTOMY N/A 10/17/2016   Procedure: LAPAROSCOPIC CHOLECYSTECTOMY WITH  INTRAOPERATIVE CHOLANGIOGRAM;  Surgeon: Griselda Miner, MD;  Location: [facility];  Service: General;  Laterality: N/A;   COLONOSCOPY     NERVE TRANSFER     on left elbow (ulnaar neuropathy)   POLYPECTOMY         Home Medications    Prior to Admission medications   Medication Sig Start Date End Date Taking? Authorizing Provider  cyclobenzaprine (FLEXERIL) 5 MG tablet Take 1 tablet (5 mg total) by mouth 3 (three) times daily as needed for muscle spasms. Do not drink alcohol or drive while taking  this medication.  May cause drowsiness. 03/26/23  Yes Particia Nearing, PA-C  cetirizine (ZYRTEC) 10 MG tablet Take 10 mg by mouth daily. Patient not taking: Reported on 12/07/2022    [provider]  EPINEPHrine 0.3 mg/0.3 mL IJ SOAJ injection Inject 0.3 mg into the muscle as needed for anaphylaxis. 01/11/22   Park Meo, FNP  fexofenadine-pseudoephedrine (ALLEGRA-D) 60-120 MG 12 hr tablet Take 1 tablet by mouth 2 (two) times daily as needed (allergies).    [provider]  Misc Natural Products (NEURIVA PO) Take by mouth.    [provider]    Family History Family History  Problem Relation Age of Onset   Hypertension Mother    Ulcers Mother    Diabetes Father    Heart disease Father    Colon polyps Father    Diabetes Paternal Aunt    Diabetes Paternal Uncle    Cancer Maternal Grandmother    Diabetes Paternal Grandmother    Cancer Paternal Grandmother    Diabetes Paternal Grandfather    Cancer Maternal Grandfather    Colon cancer Maternal Grandfather    Esophageal cancer Neg Hx    Rectal cancer Neg Hx    Stomach cancer Neg Hx     Social History Social History   Tobacco Use   Smoking status: Never   Smokeless tobacco: Never  Vaping Use   Vaping status: Never Used  Substance Use Topics  Alcohol use: Yes    Alcohol/week: 0.0 standard drinks of alcohol    Comment: occasional   Drug use: No     Allergies   Bee venom, Bextra [valdecoxib], and Other   Review of Systems Review of Systems Per HPI  Physical Exam Triage Vital Signs ED Triage Vitals  Encounter Vitals Group     BP 03/26/23 1753 (!) 154/77     Systolic BP Percentile --      Diastolic BP Percentile --      Pulse Rate 03/26/23 1753 62     Resp 03/26/23 1753 18     Temp 03/26/23 1753 98.1 F (36.7 C)     Temp Source 03/26/23 1753 Oral     SpO2 03/26/23 1753 95 %     Weight --      Height --      Head Circumference --      Peak Flow --      Pain Score 03/26/23  1747 5     Pain Loc --      Pain Education --      Exclude from Growth Chart --    No data found.  Updated Vital Signs BP (!) 154/77 (BP Location: Right Arm)   Pulse 62   Temp 98.1 F (36.7 C) (Oral)   Resp 18   SpO2 95%   Visual Acuity Right Eye Distance:   Left Eye Distance:   Bilateral Distance:    Right Eye Near:   Left Eye Near:    Bilateral Near:     Physical Exam Vitals and nursing note reviewed.  Constitutional:      Appearance: Normal appearance.  HENT:     Head: Atraumatic.  Eyes:     Extraocular Movements: Extraocular movements intact.     Conjunctiva/sclera: Conjunctivae normal.  Cardiovascular:     Rate and Rhythm: Normal rate and regular rhythm.  Pulmonary:     Effort: Pulmonary effort is normal.     Breath sounds: Normal breath sounds.  Musculoskeletal:        General: Tenderness and signs of injury present. No swelling or deformity. Normal range of motion.     Cervical back: Normal range of motion and neck supple.     Comments: No midline spinal tenderness to palpation diffusely.  Bilateral lumbosacral musculature tender to palpation.  Right cervical paraspinal and trapezius, SCM tenderness to palpation.  Range of motion intact diffusely.  Skin:    General: Skin is warm and dry.  Neurological:     General: No focal deficit present.     Mental Status: He is oriented to person, place, and time.     Motor: No weakness.     Gait: Gait normal.     Comments: All 4 extremities neurovascularly intact  Psychiatric:        Mood and Affect: Mood normal.        Thought Content: Thought content normal.        Judgment: Judgment normal.      UC Treatments / Results  Labs (all labs ordered are listed, but only abnormal results are displayed) Labs Reviewed - No data to display  EKG   Radiology DG Lumbar Spine Complete  Result Date: 03/26/2023 CLINICAL DATA:  Fall from ladder yesterday with left-sided back pain, initial encounter EXAM: LUMBAR SPINE  - COMPLETE 4+ VIEW COMPARISON:  None Available. FINDINGS: Five lumbar type vertebral bodies are well visualized. Vertebral body height is well maintained. No pars defects are  noted. Mild facet hypertrophic changes are seen. No soft tissue abnormality is noted. IMPRESSION: Mild degenerative change without acute abnormality. Electronically Signed   By: Alcide Clever M.D.   On: 03/26/2023 19:49   DG Cervical Spine Complete  Result Date: 03/26/2023 CLINICAL DATA:  Left neck soreness EXAM: CERVICAL SPINE - COMPLETE 4+ VIEW COMPARISON:  None Available. FINDINGS: Degenerative facet disease bilaterally with multilevel bilateral neural foraminal narrowing. Normal alignment. Disc spaces maintained. Prevertebral soft tissues normal. No fracture. IMPRESSION: No acute bony abnormality. Degenerative facet disease with multilevel bilateral neural foraminal narrowing. Electronically Signed   By: Charlett Nose M.D.   On: 03/26/2023 19:37    Procedures Procedures (including critical care time)  Medications Ordered in UC Medications - No data to display  Initial Impression / Assessment and Plan / UC Course  I have reviewed the triage vital signs and the nursing notes.  Pertinent labs & imaging results that were available during my care of the patient were reviewed by me and considered in my medical decision making (see chart for details).     X-rays of the cervical spine and lumbar spine negative for acute bony abnormality.  Treat with Flexeril, over-the-counter pain relievers, heat, massage.  Return for worsening symptoms.  Final Clinical Impressions(s) / UC Diagnoses   Final diagnoses:  Acute midline low back pain without sciatica  Strain of neck muscle, initial encounter  Fall, initial encounter     Discharge Instructions      We will call if anything comes back abnormal on your x-rays today.  I have sent over a muscle relaxer and you may use over-the-counter pain relievers, heat, massage, lidocaine  patches.  Follow-up as scheduled with primary care    ED Prescriptions     Medication Sig Dispense Auth. Provider   cyclobenzaprine (FLEXERIL) 5 MG tablet Take 1 tablet (5 mg total) by mouth 3 (three) times daily as needed for muscle spasms. Do not drink alcohol or drive while taking this medication.  May cause drowsiness. 15 tablet Particia Nearing, [place]      PDMP not reviewed this encounter.   Particia Nearing, [place] 03/27/23 1929

## 2023-03-29 ENCOUNTER — Ambulatory Visit (INDEPENDENT_AMBULATORY_CARE_PROVIDER_SITE_OTHER): Payer: Medicare Other | Admitting: Family Medicine

## 2023-03-29 ENCOUNTER — Encounter: Payer: Self-pay | Admitting: Family Medicine

## 2023-03-29 VITALS — BP 134/80 | HR 72 | Temp 98.3°F | Ht 68.0 in | Wt 207.0 lb

## 2023-03-29 DIAGNOSIS — M533 Sacrococcygeal disorders, not elsewhere classified: Secondary | ICD-10-CM | POA: Diagnosis not present

## 2023-03-29 DIAGNOSIS — M5030 Other cervical disc degeneration, unspecified cervical region: Secondary | ICD-10-CM

## 2023-03-29 MED ORDER — CYCLOBENZAPRINE HCL 10 MG PO TABS: 10.0000 mg | ORAL_TABLET | Freq: Three times a day (TID) | ORAL | 0 refills | Status: DC | PRN

## 2023-03-29 MED ORDER — PREDNISONE 20 MG PO TABS: ORAL_TABLET | ORAL | 0 refills | Status: DC

## 2023-03-29 MED ORDER — OXYCODONE-ACETAMINOPHEN 5-325 MG PO TABS: 1.0000 | ORAL_TABLET | ORAL | 0 refills | Status: AC | PRN

## 2023-03-29 NOTE — Progress Notes (Signed)
Subjective:    Patient ID: Sergio Nelson, male    DOB: 05/01/1950, 72 y.o.   MRN: [id number]  HPI 4 days ago, the patient was standing on a ladder.  The ladder slipped out from under him and he felt 10 foot landing on cardboard boxes and the concrete floor.  He suffered a large bruise over his coccyx and his right gluteus.  He went to the urgent care where an x-ray of the lumbar spine revealed no fracture.  He also had a cervical spine x-ray that revealed facet joint degenerative changes with bilateral neural foraminal narrowing.  He has had neck pain for more than a month.  The pain is primarily on the right side and is described as a tightness like a crick in his neck.  Turning his head side-to-side causes crepitus.  He notices decreased range of motion.  This occurred before the fall.  Since the fall, he reports low back pain particularly over the sacrum and coccyx.  He denies any chest pain shortness or hemoptysis.  He denies any abdominal pain but he has had constipation since the fall Past Medical History:  Diagnosis Date   Allergy    Arthritis    hands   Cataract    removed bilat 2021   GERD (gastroesophageal reflux disease)    Past Surgical History:  Procedure Laterality Date   CATARACT EXTRACTION Bilateral 2021   CHOLECYSTECTOMY N/A 10/17/2016   Procedure: LAPAROSCOPIC CHOLECYSTECTOMY WITH  INTRAOPERATIVE CHOLANGIOGRAM;  Surgeon: Chevis Pretty III, MD;  Location: [facility];  Service: General;  Laterality: N/A;   COLONOSCOPY     NERVE TRANSFER     on left elbow (ulnaar neuropathy)   POLYPECTOMY     Current Outpatient Medications on File Prior to Visit  Medication Sig Dispense Refill   cetirizine (ZYRTEC) 10 MG tablet Take 10 mg by mouth daily.     EPINEPHrine 0.3 mg/0.3 mL IJ SOAJ injection Inject 0.3 mg into the muscle as needed for anaphylaxis. 1 each 3   fexofenadine-pseudoephedrine (ALLEGRA-D) 60-120 MG 12 hr tablet Take 1 tablet by mouth 2 (two) times daily as needed (allergies).      Misc Natural Products (NEURIVA PO) Take by mouth.     No current facility-administered medications on file prior to visit.   Allergies  Allergen Reactions   Bee Venom Hives   Bextra [Valdecoxib] Other (See Comments)    Flu symptoms    Other Itching   Social History   Socioeconomic History   Marital status: Married    Spouse name: Not on file   Number of children: 2   Years of education: 16   Highest education level: Not on file  Occupational History   Occupation: Art gallery manager  Tobacco Use   Smoking status: Never   Smokeless tobacco: Never  Vaping Use   Vaping status: Never Used  Substance and Sexual Activity   Alcohol use: Yes    Alcohol/week: 0.0 standard drinks of alcohol    Comment: occasional   Drug use: No   Sexual activity: Yes  Other Topics Concern   Not on file  Social History Narrative   Fun: Travel, work on cars, go to [employer], and play golf.    Denies any religious beliefs that effect health care.    Social Drivers of Corporate investment banker Strain: Not on file  Food Insecurity: Not on file  Transportation Needs: Not on file  Physical Activity: Not on file  Stress: Not  on file  Social Connections: Not on file  Intimate Partner Violence: Not on file   Family History  Problem Relation Age of Onset   Hypertension Mother    Ulcers Mother    Diabetes Father    Heart disease Father    Colon polyps Father    Diabetes Paternal Aunt    Diabetes Paternal Uncle    Cancer Maternal Grandmother    Diabetes Paternal Grandmother    Cancer Paternal Grandmother    Diabetes Paternal Grandfather    Cancer Maternal Grandfather    Colon cancer Maternal Grandfather    Esophageal cancer Neg Hx    Rectal cancer Neg Hx    Stomach cancer Neg Hx      Review of Systems  All other systems reviewed and are negative.      Objective:   Physical Exam Vitals reviewed.  Constitutional:      General: He is not in acute distress.    Appearance: Normal  appearance. He is well-developed and normal weight. He is not ill-appearing, toxic-appearing or diaphoretic.  HENT:     Head: Normocephalic and atraumatic.     Right Ear: External ear normal.     Left Ear: External ear normal.  Neck:     Thyroid: No thyromegaly.     Vascular: No JVD.     Trachea: No tracheal deviation.  Cardiovascular:     Rate and Rhythm: Normal rate and regular rhythm.     Heart sounds: Normal heart sounds. No murmur heard.    No friction rub. No gallop.  Pulmonary:     Effort: Pulmonary effort is normal. No respiratory distress.     Breath sounds: Normal breath sounds. No stridor. No wheezing, rhonchi or rales.  Chest:     Chest wall: No tenderness.  Abdominal:     General: Bowel sounds are normal. There is no distension.     Palpations: Abdomen is soft.     Tenderness: There is no abdominal tenderness. There is no guarding.  Musculoskeletal:     Cervical back: Spasms and tenderness present. Decreased range of motion.     Lumbar back: Tenderness and bony tenderness present. Decreased range of motion.       Back:  Lymphadenopathy:     Cervical: No cervical adenopathy.  Skin:    General: Skin is warm.     Coloration: Skin is not pale.     Findings: Bruising present. No erythema or rash.  Neurological:     General: No focal deficit present.     Mental Status: He is alert and oriented to person, place, and time. Mental status is at baseline.     Cranial Nerves: No cranial nerve deficit.     Sensory: No sensory deficit.     Motor: No weakness or abnormal muscle tone.     Coordination: Coordination normal.     Gait: Gait normal.     Deep Tendon Reflexes: Reflexes normal.  Psychiatric:        Mood and Affect: Mood normal.        Behavior: Behavior normal.        Thought Content: Thought content normal.        Judgment: Judgment normal.           Assessment & Plan:  Coccydynia  DDD (degenerative disc disease), cervical Start prednisone taper pack  for inflammation in the cervical spine.  Use Flexeril 10 mg every 8 hours for muscle spasm and pain in his  neck and low back.  Use Percocet for the pain in his coccyx as needed.  Start bowel regimen of MiraLAX twice daily with Dulcolax as needed.  Push fluids.

## 2023-05-15 DIAGNOSIS — M545 Low back pain, unspecified: Secondary | ICD-10-CM | POA: Diagnosis not present

## 2023-05-22 ENCOUNTER — Telehealth (INDEPENDENT_AMBULATORY_CARE_PROVIDER_SITE_OTHER): Payer: Self-pay | Admitting: Otolaryngology

## 2023-05-22 NOTE — Telephone Encounter (Signed)
Left vm to confirm appt and address for 05/23/2023.

## 2023-05-23 ENCOUNTER — Encounter (INDEPENDENT_AMBULATORY_CARE_PROVIDER_SITE_OTHER): Payer: Self-pay

## 2023-05-23 ENCOUNTER — Ambulatory Visit (INDEPENDENT_AMBULATORY_CARE_PROVIDER_SITE_OTHER): Payer: Medicare Other | Admitting: Audiology

## 2023-05-23 ENCOUNTER — Ambulatory Visit (INDEPENDENT_AMBULATORY_CARE_PROVIDER_SITE_OTHER): Payer: Medicare Other | Admitting: Otolaryngology

## 2023-05-23 VITALS — BP 153/87 | HR 70 | Ht 68.0 in | Wt 200.0 lb

## 2023-05-23 DIAGNOSIS — H903 Sensorineural hearing loss, bilateral: Secondary | ICD-10-CM | POA: Diagnosis not present

## 2023-05-23 DIAGNOSIS — H6123 Impacted cerumen, bilateral: Secondary | ICD-10-CM

## 2023-05-23 DIAGNOSIS — Z0110 Encounter for examination of ears and hearing without abnormal findings: Secondary | ICD-10-CM

## 2023-05-23 DIAGNOSIS — H9313 Tinnitus, bilateral: Secondary | ICD-10-CM

## 2023-05-23 NOTE — Progress Notes (Signed)
 Patient ID: Sergio Nelson, male   DOB: 1951/03/31, 73 y.o.   MRN: [id number]  Follow-up: Hearing loss, tinnitus  HPI: The patient is a 73 year old male who returns today for his follow-up evaluation.  The patient was previously seen for bilateral high-frequency sensorineural hearing loss and bilateral tinnitus.  He was fitted with bilateral hearing aids.  The patient returns today reporting persistent hearing loss.  He continues to have bilateral tinnitus.  The hearing aids have helped.  Currently he denies any otalgia, otorrhea, or vertigo.  Exam: General: Communicates without difficulty, well nourished, no acute distress. Head: Normocephalic, no evidence injury, no tenderness, facial buttresses intact without stepoff. Face/sinus: No tenderness to palpation and percussion. Facial movement is normal and symmetric. Eyes: PERRL, EOMI. No scleral icterus, conjunctivae clear. Neuro: CN II exam reveals vision grossly intact.  No nystagmus at any point of gaze. Ears: Auricles well formed without lesions.  Bilateral cerumen impaction.  Nose: External evaluation reveals normal support and skin without lesions.  Dorsum is intact.  Anterior rhinoscopy reveals congested mucosa over anterior aspect of inferior turbinates and intact septum.  No purulence noted. Oral:  Oral cavity and oropharynx are intact, symmetric, without erythema or edema.  Mucosa is moist without lesions. Neck: Full range of motion without pain.  There is no significant lymphadenopathy.  No masses palpable.  Thyroid  bed within normal limits to palpation.  Parotid glands and submandibular glands equal bilaterally without mass.  Trachea is midline. Neuro:  CN 2-12 grossly intact.   Procedure: Bilateral cerumen disimpaction Anesthesia: None Description: Under the operating microscope, the cerumen is carefully removed with a combination of cerumen currette, alligator forceps, and suction catheters.  After the cerumen is removed, the TMs are noted to be  normal.  No mass, erythema, or lesions. The patient tolerated the procedure well.    His hearing test shows stable bilateral high-frequency sensorineural hearing loss.  Assessment: 1.  Bilateral cerumen impaction.  After the disimpaction procedure, both tympanic membranes and middle ear spaces are noted to be normal. 2.  Stable bilateral high-frequency sensorineural hearing loss. 3.  Bilateral subjective tinnitus, secondary to his hearing loss.  Plan: 1.  Otomicroscopy with bilateral cerumen disimpaction. 2.  The physical exam findings and the hearing test results are reviewed with the patient. 3.  Continue the use of his hearing aids. 4.  The strategies to cope with tinnitus are discussed. 5.  The patient will return for reevaluation in 1 year.

## 2023-05-23 NOTE — Progress Notes (Signed)
  [address] [place], [place] [address] [phone]  Audiological Evaluation    Name: WESTLEY BLASS     DOB:   April 05, 1951      MRN:   [id number]                                                                                     Service Date: 05/23/2023     Accompanied by: unaccompanied   Patient comes today after Dr. Karis, ENT sent a referral for a hearing evaluation due to concerns with tinnitus.   Symptoms Yes Details  Hearing loss  [x]  Previous test was completed in 2024 at Dr. Rojean clinic.  Tinnitus  [x]  Yes, in both ears  Ear pain/ Ear infections  []    Balance problems  []    Noise exposure  []    Previous ear surgeries  []    Family history  []    Amplification  [x]  Has a set of Phonak hearing aids.  Other  [x]  Hearing test was re-checked after wax was removed form his ears.    Otoscopy: Right ear: Clear external ear canals and notable landmarks visualized on the tympanic membrane. Left ear:  Clear external ear canals and notable landmarks visualized on the tympanic membrane.  Tympanometry: Right ear: Type Ad- Normal external ear canal volume with normal middle ear pressure and high tympanic membrane compliance Left ear: Type A- Normal external ear canal volume with normal middle ear pressure and tympanic membrane compliance    Pure tone Audiometry: Right ear- Borderline normal to profound sensorineural hearing loss from 250 Hz - 8000 Hz. Left ear-  Mild to profound sensorineural hearing loss from 250 Hz - 8000 Hz.  Speech Audiometry: Right ear- Speech Reception Threshold (SRT) was obtained at 35 dBHL Left ear-Speech Reception Threshold (SRT) was obtained at 50 dBHL   Word Recognition Score Tested using NU-6 (MLV) Right ear: 72% was obtained at a presentation level of 85 dBHL with contralateral masking which is deemed as  fair Left ear: 56% was obtained at a presentation level of 90 dBHL with contralateral masking which is deemed as  poor   The  hearing test results were completed under headphones and results are deemed to be of good reliability. Test technique:  conventional     Impression: There is not a significant difference in pure-tone thresholds between ears. There is not a significant difference in the word recognition score in between ears.  Hearing asymmetry continues to be present, worse in the left ear.  Recommendations: Follow up with ENT as scheduled for today. Return for a hearing evaluation if concerns with hearing changes arise or per MD recommendation.   Layne Dilauro MARIE LEROUX-MARTINEZ, AUD

## 2023-07-25 ENCOUNTER — Encounter: Payer: Self-pay | Admitting: Family Medicine

## 2023-07-26 ENCOUNTER — Other Ambulatory Visit: Payer: Self-pay | Admitting: Family Medicine

## 2023-07-26 MED ORDER — CYCLOBENZAPRINE HCL 10 MG PO TABS: 10.0000 mg | ORAL_TABLET | Freq: Three times a day (TID) | ORAL | 2 refills | Status: DC | PRN

## 2023-08-06 ENCOUNTER — Encounter: Payer: Self-pay | Admitting: Family Medicine

## 2023-08-06 ENCOUNTER — Ambulatory Visit: Admitting: Family Medicine

## 2023-08-06 VITALS — BP 146/82 | HR 75 | Temp 97.6°F | Ht 68.0 in | Wt 199.0 lb

## 2023-08-06 DIAGNOSIS — M549 Dorsalgia, unspecified: Secondary | ICD-10-CM

## 2023-08-06 DIAGNOSIS — M5412 Radiculopathy, cervical region: Secondary | ICD-10-CM

## 2023-08-06 DIAGNOSIS — M5030 Other cervical disc degeneration, unspecified cervical region: Secondary | ICD-10-CM | POA: Diagnosis not present

## 2023-08-06 NOTE — Progress Notes (Signed)
 Subjective:    Patient ID: Sergio Nelson, male    DOB: [birth year], 73 y.o.   MRN: [id number]  Back Pain  Patient has been dealing with neck and back pain now for more than 5 months.  Pain is primarily in the cervical spine just to the right of C3.  Gaze down his right neck into his right shoulder and even into the right lateral tricep just above his elbow.  He denies any numbness or tingling in his right arm.  He denies any weakness or loss of grip strength.  However range of motion in his neck exacerbates the pain.  It hurts to drive.  It hurts to sit with his head unsupported for long periods of time.  For instance he had to wear a neck brace to drive to the beach recently because of the pain in his neck.  Lateral rotation exacerbates the pain.  He also reports pain and stiffness in his upper posterior trapezius muscle.  Patient also complains of pain in his lower mid back.  He had an x-ray of the lumbar spine last fall that showed mild degenerative disc disease.  However the pain today is primarily over T10.  There is no spinous process tenderness to palpation however this is the area where the pain is located and he describes the pain radiating in a bandlike fashion into his ribs on either side of the vertebrae.  He has been taking turmeric anti-inflammatories and muscle relaxers for the last several months with no relief. Past Medical History:  Diagnosis Date   Allergy    Arthritis    hands   Cataract    removed bilat 2021   GERD (gastroesophageal reflux disease)    Past Surgical History:  Procedure Laterality Date   CATARACT EXTRACTION Bilateral 2021   CHOLECYSTECTOMY N/A 10/17/2016   Procedure: LAPAROSCOPIC CHOLECYSTECTOMY WITH  INTRAOPERATIVE CHOLANGIOGRAM;  Surgeon: Caralyn Chandler, MD;  Location: [facility];  Service: General;  Laterality: N/A;   COLONOSCOPY     NERVE TRANSFER     on left elbow (ulnaar neuropathy)   POLYPECTOMY     Current Outpatient Medications on File Prior to  Visit  Medication Sig Dispense Refill   cyclobenzaprine  (FLEXERIL ) 10 MG tablet Take 1 tablet (10 mg total) by mouth 3 (three) times daily as needed for muscle spasms. 30 tablet 2   EPINEPHrine  0.3 mg/0.3 mL IJ SOAJ injection Inject 0.3 mg into the muscle as needed for anaphylaxis. 1 each 3   fexofenadine-pseudoephedrine (ALLEGRA-D) 60-120 MG 12 hr tablet Take 1 tablet by mouth 2 (two) times daily as needed (allergies).     No current facility-administered medications on file prior to visit.   Allergies  Allergen Reactions   Bee Venom Hives   Bextra [Valdecoxib] Other (See Comments)    Flu symptoms    Other Itching   Social History   Socioeconomic History   Marital status: Married    Spouse name: Not on file   Number of children: 2   Years of education: 16   Highest education level: Not on file  Occupational History   Occupation: Art gallery manager  Tobacco Use   Smoking status: Never   Smokeless tobacco: Never  Vaping Use   Vaping status: Never Used  Substance and Sexual Activity   Alcohol use: Yes    Alcohol/week: 0.0 standard drinks of alcohol    Comment: occasional   Drug use: No   Sexual activity: Yes  Other Topics Concern  Not on file  Social History Narrative   Fun: Travel, work on cars, go to [employer], and play golf.    Denies any religious beliefs that effect health care.    Social Drivers of Corporate investment banker Strain: Not on file  Food Insecurity: Not on file  Transportation Needs: Not on file  Physical Activity: Not on file  Stress: Not on file  Social Connections: Not on file  Intimate Partner Violence: Not on file   Family History  Problem Relation Age of Onset   Hypertension Mother    Ulcers Mother    Diabetes Father    Heart disease Father    Colon polyps Father    Diabetes Paternal Aunt    Diabetes Paternal Uncle    Cancer Maternal Grandmother    Diabetes Paternal Grandmother    Cancer Paternal Grandmother    Diabetes Paternal  Grandfather    Cancer Maternal Grandfather    Colon cancer Maternal Grandfather    Esophageal cancer Neg Hx    Rectal cancer Neg Hx    Stomach cancer Neg Hx      Review of Systems  Musculoskeletal:  Positive for back pain.  All other systems reviewed and are negative.      Objective:   Physical Exam Vitals reviewed.  Constitutional:      General: He is not in acute distress.    Appearance: Normal appearance. He is well-developed and normal weight. He is not ill-appearing, toxic-appearing or diaphoretic.  Neck:   Cardiovascular:     Rate and Rhythm: Normal rate and regular rhythm.     Heart sounds: Normal heart sounds. No murmur heard.    No friction rub. No gallop.  Pulmonary:     Effort: Pulmonary effort is normal.     Breath sounds: Normal breath sounds.  Musculoskeletal:        General: Tenderness present.     Cervical back: Tenderness and bony tenderness present. No spasms. Pain with movement present. Decreased range of motion.     Thoracic back: No spasms, tenderness or bony tenderness. Normal range of motion.       Back:  Neurological:     General: No focal deficit present.     Mental Status: He is alert and oriented to person, place, and time. Mental status is at baseline.     Cranial Nerves: No cranial nerve deficit.     Sensory: No sensory deficit.     Motor: No weakness or abnormal muscle tone.     Coordination: Coordination normal.     Gait: Gait normal.           Assessment & Plan:  Mid back pain - Plan: DG Thoracic Spine W/Swimmers  Cervical radiculopathy  DDD (degenerative disc disease), cervical Patient has tried and failed prednisone , Flexeril , anti-inflammatories, and turmeric.  X-ray shows cervical degenerative disc disease.  I am concerned about possible nerve root impingement.  Recommended an MRI of the cervical spine to evaluate further.  If the MRI is unremarkable, would recommend physical therapy.  Pain in the middle of the back sounds  more muscular.  Begin by obtaining an x-ray of the thoracic spine to evaluate further.  Consider physical therapy for this if xray shows only mild ddd.  Consider mri if xray shows severe ddd.

## 2023-08-07 ENCOUNTER — Ambulatory Visit
Admission: RE | Admit: 2023-08-07 | Discharge: 2023-08-07 | Disposition: A | Discharge status: Home / Self Care | Source: Ambulatory Visit | Attending: Family Medicine | Admitting: Family Medicine

## 2023-08-07 DIAGNOSIS — M546 Pain in thoracic spine: Secondary | ICD-10-CM | POA: Diagnosis not present

## 2023-08-07 DIAGNOSIS — M549 Dorsalgia, unspecified: Secondary | ICD-10-CM

## 2023-08-13 ENCOUNTER — Other Ambulatory Visit: Payer: Self-pay | Admitting: Family Medicine

## 2023-08-13 ENCOUNTER — Telehealth: Payer: Self-pay

## 2023-08-13 DIAGNOSIS — M549 Dorsalgia, unspecified: Secondary | ICD-10-CM

## 2023-08-13 DIAGNOSIS — R918 Other nonspecific abnormal finding of lung field: Secondary | ICD-10-CM

## 2023-08-13 NOTE — Telephone Encounter (Signed)
 Sergio Lefort, MD  Verneda Golder, LPN Radiology has not read his xray yet.  I read it myself which I am not a radiologist but it shows he has scoliosis in his thoracic spine (crooked) which can certainly cause mid back pain.  I would recommend PT for that to try to help the muscle pain.  However, there is a cloudy area in the left lung that I cannot explain.  I will order a ct of his chest to look closer at this area to try to figure out what it could be.

## 2023-08-17 ENCOUNTER — Ambulatory Visit
Admission: RE | Admit: 2023-08-17 | Discharge: 2023-08-17 | Disposition: A | Discharge status: Home / Self Care | Source: Ambulatory Visit | Attending: Family Medicine | Admitting: Family Medicine

## 2023-08-17 DIAGNOSIS — R911 Solitary pulmonary nodule: Secondary | ICD-10-CM | POA: Diagnosis not present

## 2023-08-17 DIAGNOSIS — R918 Other nonspecific abnormal finding of lung field: Secondary | ICD-10-CM

## 2023-08-17 MED ORDER — IOPAMIDOL (ISOVUE-300) INJECTION 61%
75.0000 mL | Freq: Once | INTRAVENOUS | Status: AC | PRN
  Administered 2023-08-17: 75 mL via INTRAVENOUS

## 2023-08-27 ENCOUNTER — Encounter: Payer: Self-pay | Admitting: Family Medicine

## 2023-08-30 ENCOUNTER — Ambulatory Visit (INDEPENDENT_AMBULATORY_CARE_PROVIDER_SITE_OTHER): Admitting: Physical Therapy

## 2023-08-30 ENCOUNTER — Other Ambulatory Visit: Payer: Self-pay

## 2023-08-30 DIAGNOSIS — M549 Dorsalgia, unspecified: Secondary | ICD-10-CM

## 2023-08-30 DIAGNOSIS — M6281 Muscle weakness (generalized): Secondary | ICD-10-CM

## 2023-08-30 DIAGNOSIS — M542 Cervicalgia: Secondary | ICD-10-CM

## 2023-08-30 DIAGNOSIS — R293 Abnormal posture: Secondary | ICD-10-CM

## 2023-08-30 NOTE — Therapy (Addendum)
 OUTPATIENT PHYSICAL THERAPY CERVICAL EVALUATION   Patient Name: Sergio Nelson MRN: [id number] DOB:1950-05-12, 73 y.o., male Today's Date: 08/30/2023  END OF SESSION:  PT End of Session - 08/30/23 1101     Visit Number 1    Authorization Type Medicare    PT Start Time 1101    PT Stop Time 1145    PT Time Calculation (min) 44 min    Activity Tolerance Patient tolerated treatment well             Past Medical History:  Diagnosis Date   Allergy    Arthritis    hands   Cataract    removed bilat 2021   GERD (gastroesophageal reflux disease)    Past Surgical History:  Procedure Laterality Date   CATARACT EXTRACTION Bilateral 2021   CHOLECYSTECTOMY N/A 10/17/2016   Procedure: LAPAROSCOPIC CHOLECYSTECTOMY WITH  INTRAOPERATIVE CHOLANGIOGRAM;  Surgeon: Caralyn Chandler, MD;  Location: [facility];  Service: General;  Laterality: N/A;   COLONOSCOPY     NERVE TRANSFER     on left elbow (ulnaar neuropathy)   POLYPECTOMY     Patient Active Problem List   Diagnosis Date Noted   Impacted cerumen of both ears 05/23/2023   Sensorineural hearing loss, bilateral 05/23/2023   Tinnitus of both ears 05/23/2023   Seasonal allergies 10/22/2017   Elevated blood pressure reading without diagnosis of hypertension 10/15/2016   Cholecystitis 10/15/2016   RUQ pain 10/12/2016   Chest pain 10/12/2016   Hyperlipidemia 10/12/2016   Routine general medical examination at a health care facility 07/15/2014    PCP: Austine Lefort, MD  REFERRING PROVIDER: Austine Lefort, MD  REFERRING DIAG: M54.9 (ICD-10-CM) - Mid back pain  THERAPY DIAG:  Mid back pain  Muscle weakness (generalized)  Abnormal posture  Cervicalgia  Rationale for Evaluation and Treatment: Rehabilitation  ONSET DATE: November 2024  SUBJECTIVE:                                                                                                                                                                                                          SUBJECTIVE STATEMENT: Started in November. Pt states he had a long flight and his neck started hurting. Took some over counter pain medication. Still bothering him in December and then he fell off 10 foot ladder on to concrete floor and bruised up his back side. Got x-rays done and saw PCP but didn't see any fractures but pt was still swollen. Was given meds for management. States swelling and bruising got better and his  pain changed a little bit but could feel it more in his lumbo/thoracic spine and still feeling it in his neck. At this point his neck is hurting more than the back (depending on what he's doing).  Hand dominance: Right  PERTINENT HISTORY:  Reports he has had intermittent back pains but has normally been able to self manage  PAIN:  Are you having pain? Yes: NPRS scale: 3 or 4 currently, at worst 8 Pain location: Neck down to R arm/shoulder Pain description: Aching Aggravating factors: Walking, driving  Relieving factors: Arthritis tylenol , muscle relaxer, relaxed shoulder, heating pad  Yes: NPRS scale: 2 or 3 currently, at worst 7 or 8 Pain location: Lower thoracic and upper lumbar Pain description: Tender to the touch Aggravating factors: Walking, driving, when not lifting correctly Relieving factors: Arthritis tylenol , muscle relaxer   PRECAUTIONS: None  RED FLAGS: None     WEIGHT BEARING RESTRICTIONS: No  FALLS:  Has patient fallen in last 6 months? Yes. 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  DIAGNOSTIC  FINDINGS:  08/07/23 Thoracic spine x-ray IMPRESSION: No acute fracture or dislocation. Chronic compression of T12 is identified not changed compared prior exam.  03/26/23 cervical spine x-ray IMPRESSION: No acute bony abnormality. Degenerative facet disease with multilevel bilateral neural foraminal narrowing.  03/26/23 lumbar spine x-ray IMPRESSION: Mild degenerative change without acute abnormality.  PATIENT SURVEYS:  The Patient-Specific Functional Scale  Initial:  I am going to ask you to identify up to 3 important activities that you are unable to do or are having difficulty with as a result of this problem.  Today are there any activities that you are unable to do or having difficulty with because of this?  (Patient shown scale and patient rated each activity)  Follow up: When you first came in you had difficulty performing these activities.  Today do you still have difficulty?  Patient-Specific activity scoring scheme (Point to one number):  0 1 2 3 4 5 6 7 8 9  10 Unable                                                                                                          Able to perform To perform  activity at the same Activity         Level as before                                                                                                                       Injury or problem Activity Initial (eval): Follow up:  Walking for extended period of time 7   2.   Driving for extended period of time  7   3.   Working around the house 5   Average 19/3 = 6.33      COGNITION: Overall cognitive status: Within functional limits for tasks assessed  SENSATION: WFL  POSTURE: rounded shoulders and increased thoracic kyphosis  PALPATION: No overt tenderness to palpation in C spine or periscapular muscles Mild tightness in thoracolumbar paraspinals  Tender with PA mobs along  lower thoracic Hypomobile throughout mid thoracic to upper thoracic   CERVICAL ROM:   Active ROM A/PROM (deg) eval  Flexion 60* (radiates to R shoulder)  Extension 40  Right lateral flexion 25*  Left lateral flexion 24*  Right rotation 40*  Left rotation 40   (Blank rows = not tested, * = pain)  UPPER EXTREMITY ROM: WNL  UPPER EXTREMITY MMT:  MMT Right eval Left eval  Shoulder flexion 5 5  Shoulder extension 5 5  Shoulder abduction 4* 5  Shoulder adduction    Shoulder extension    Shoulder internal rotation 4+ 4+  Shoulder external rotation 4 4  Middle trapezius in prone 4- 4-  Lower trapezius in prone 3- 3  Elbow flexion    Elbow extension    Wrist flexion    Wrist extension    Wrist ulnar deviation    Wrist radial deviation    Wrist pronation    Wrist supination    Grip strength     (Blank rows = not tested, * = pain)  CERVICAL SPECIAL TESTS:  Neck flexor muscle endurance test: Positive, Spurling's test: Positive, and Distraction test: Negative  FUNCTIONAL TESTS:  Did not assess  TREATMENT DATE: 08/30/23 See HEP below                                                                                                                                 PATIENT EDUCATION:  Education details: Exam findings, POC, initial HEP, MRI vs not Person educated: Patient Education method: Explanation, Demonstration, and Handouts Education comprehension: verbalized understanding, returned demonstration, and  needs further education  HOME EXERCISE PROGRAM: Access Code: [email or web address] URL: [email or web address] Date: 08/30/2023 Prepared by: Zaylan Kissoon April Erman [place]ward  Exercises - Supine Cervical Retraction with Towel  - 1 x daily - 7 x weekly - 2 sets - 10 reps - 3 sec hold - Supine Deep Neck Flexor Training  - 1 x daily - 7 x weekly - 2 sets - 10 reps - 3 sec hold - Supine Lower Trunk Rotation  - 1 x daily - 7 x weekly - 1 sets - 5 reps - 10 sec hold - Supine  Transversus Abdominis Bracing - Hands on Stomach  - 1 x daily - 7 x weekly - 2 sets - 10 reps  ASSESSMENT:  CLINICAL IMPRESSION: Patient is a 73 y.o. M who was seen today for physical therapy evaluation and treatment for neck pain and lumbothoracic pain. PMH is significant for fall in December from a 11ft ladder that exacerbated his symptoms. Assessment is significant for highly hypomobile thoracic spine with resultant abnormal posture, reduced cervical ROM, weak deep neck muscles with some midback and core weakness. Neck pain s/s appear most consistent with possible disc herniation while lumbothoracic pain appears most consistent to a type of muscle spasm. Pt will benefit from PT to address these deficits for return to baseline.   OBJECTIVE IMPAIRMENTS: Abnormal gait, decreased activity tolerance, decreased coordination, decreased endurance, decreased mobility, difficulty walking, decreased ROM, decreased strength, hypomobility, increased fascial restrictions, increased muscle spasms, improper body mechanics, postural dysfunction, and pain.   ACTIVITY LIMITATIONS: carrying, lifting, standing, sleeping, and locomotion level  PARTICIPATION LIMITATIONS: cleaning, driving, shopping, community activity, and yard work  PERSONAL FACTORS: Age, Fitness, Past/current experiences, and Time since onset of injury/illness/exacerbation are also affecting patient's functional outcome.   REHAB POTENTIAL: Good  CLINICAL DECISION MAKING: Evolving/moderate complexity  EVALUATION COMPLEXITY: Moderate   GOALS: Goals reviewed with patient? Yes  SHORT TERM GOALS: Target date: 09/20/2023   Pt will be ind with initial HEP Baseline:  Goal status: INITIAL  2.  Pt will be able to perform TSA contraction without glute/LE compensation to demo improving core strength/activation Baseline:  Goal status: INITIAL    LONG TERM GOALS: Target date: 10/11/2023   Pt will be ind with management and progression of  HEP Baseline:  Goal status: INITIAL  2.  Pt will have improved PSFS average score to >/=8.33 to demo MCID Baseline:  Goal status: INITIAL  3.  Pt will be able to perform all cervical ROM without pain Baseline:  Goal status: INITIAL  4.  Pt will report >/=75% improvement in overall pain with activity Baseline:  Goal status: INITIAL  5.  Pt will be able to lift and carry at least 25# without increased lumbar or cervical strain Baseline:  Goal status: INITIAL  PLAN:  PT FREQUENCY: 1-2x/week  PT DURATION: 6 weeks  PLANNED INTERVENTIONS: [id number]- PT Re-evaluation, 97750- Physical Performance Testing, 97110-Therapeutic exercises, 97530- Therapeutic activity, [id number]- Neuromuscular re-education, 97535- Self Care, [id number]- Manual therapy, [id number]- Gait training, [phone]- Aquatic Therapy, [phone]- Electrical stimulation (unattended), [id number]- Ultrasound, [id number]- Traction (mechanical), [id number]- Ionotophoresis 4mg /ml Dexamethasone , Patient/Family education, Balance training, Stair training, Taping, Dry Needling, Joint mobilization, Spinal mobilization, Cryotherapy, and Moist heat  PLAN FOR NEXT SESSION: Assess response to HEP. Modify/progress accordingly. Core/midback strengthening. Deep neck muscle activation/strengthening.    Giovanna Kemmerer April Ma L Tyiesha Brackney, PT, DPT 08/30/2023, 11:01 AM

## 2023-09-04 ENCOUNTER — Ambulatory Visit (INDEPENDENT_AMBULATORY_CARE_PROVIDER_SITE_OTHER): Payer: Self-pay | Admitting: Physical Therapy

## 2023-09-04 DIAGNOSIS — M542 Cervicalgia: Secondary | ICD-10-CM | POA: Diagnosis not present

## 2023-09-04 DIAGNOSIS — M549 Dorsalgia, unspecified: Secondary | ICD-10-CM

## 2023-09-04 DIAGNOSIS — R2689 Other abnormalities of gait and mobility: Secondary | ICD-10-CM

## 2023-09-04 DIAGNOSIS — M6281 Muscle weakness (generalized): Secondary | ICD-10-CM

## 2023-09-04 NOTE — Therapy (Addendum)
 OUTPATIENT PHYSICAL THERAPY CERVICAL EVALUATION   Patient Name: Sergio Nelson MRN: [id number] DOB:May 06, 1950, 73 y.o., male Today's Date: 09/04/2023  END OF SESSION:  PT End of Session - 09/04/23 0846     Visit Number 2    Number of Visits 12    Date for PT Re-Evaluation 10/11/23    Authorization Type Medicare    PT Start Time 0846    PT Stop Time 0925    PT Time Calculation (min) 39 min    Activity Tolerance Patient tolerated treatment well              Past Medical History:  Diagnosis Date   Allergy    Arthritis    hands   Cataract    removed bilat 2021   GERD (gastroesophageal reflux disease)    Past Surgical History:  Procedure Laterality Date   CATARACT EXTRACTION Bilateral 2021   CHOLECYSTECTOMY N/A 10/17/2016   Procedure: LAPAROSCOPIC CHOLECYSTECTOMY WITH  INTRAOPERATIVE CHOLANGIOGRAM;  Surgeon: Caralyn Chandler, MD;  Location: [facility];  Service: General;  Laterality: N/A;   COLONOSCOPY     NERVE TRANSFER     on left elbow (ulnaar neuropathy)   POLYPECTOMY     Patient Active Problem List   Diagnosis Date Noted   Impacted cerumen of both ears 05/23/2023   Sensorineural hearing loss, bilateral 05/23/2023   Tinnitus of both ears 05/23/2023   Seasonal allergies 10/22/2017   Elevated blood pressure reading without diagnosis of hypertension 10/15/2016   Cholecystitis 10/15/2016   RUQ pain 10/12/2016   Chest pain 10/12/2016   Hyperlipidemia 10/12/2016   Routine general medical examination at a health care facility 07/15/2014    PCP: Austine Lefort, MD  REFERRING PROVIDER: Austine Lefort, MD  REFERRING DIAG: M54.9 (ICD-10-CM) - Mid back pain  THERAPY DIAG:  Mid back pain  Cervicalgia  Muscle weakness (generalized)  Other abnormalities of gait and mobility  Rationale for Evaluation and Treatment: Rehabilitation  ONSET DATE: November 2024  SUBJECTIVE:                                                                                                                                                                                                          SUBJECTIVE STATEMENT: Pt states he thinks he over did it this weekend -- worked on cars and mowed the grass. Has been doing okay with the exercise. Feels better after doing them.   From eval: Started in November. Pt states he had a long flight and his neck started hurting. Took some over counter  pain medication. Still bothering him in December and then he fell off 10 foot ladder on to concrete floor and bruised up his back side. Got x-rays done and saw PCP but didn't see any fractures but pt was still swollen. Was given meds for management. States swelling and bruising got better and his pain changed a little bit but could feel it more in his lumbo/thoracic spine and still feeling it in his neck. At this point his neck is hurting more than the back (depending on what he's doing).  Hand dominance: Right  PERTINENT HISTORY:  Reports he has had intermittent back pains but has normally been able to self manage  PAIN:  Are you having pain? Yes: NPRS scale: 3 currently, at worst 8 Pain location: Neck down to R arm/shoulder Pain description: Aching Aggravating factors: Walking, driving  Relieving factors: Arthritis tylenol , muscle relaxer, relaxed shoulder, heating pad  Yes: NPRS scale: 2 or 3 currently, at worst 7 or 8 Pain location: Lower thoracic and upper lumbar Pain description: Tender to the touch Aggravating factors: Walking, driving, when not lifting correctly Relieving factors: Arthritis tylenol , muscle relaxer   PRECAUTIONS: None  RED FLAGS: None     WEIGHT BEARING RESTRICTIONS: No  FALLS:  Has patient fallen in last 6 months? Yes. 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  DIAGNOSTIC FINDINGS:  08/07/23 Thoracic spine x-ray IMPRESSION: No acute fracture or dislocation. Chronic compression of T12 is identified not changed compared prior exam.  03/26/23 cervical spine x-ray IMPRESSION: No acute bony abnormality. Degenerative facet disease with multilevel bilateral neural foraminal narrowing.  03/26/23 lumbar spine x-ray IMPRESSION: Mild degenerative change without acute abnormality.  PATIENT SURVEYS:  The Patient-Specific Functional Scale  Initial:  I am going to ask you to identify up to 3 important activities that you are unable to do or are having difficulty with as a result of this problem.  Today are there any activities that you are unable to do or having difficulty with because of this?  (Patient shown scale and patient rated each activity)  Follow up: When you first came in you had difficulty performing these activities.  Today do you still have difficulty?  Patient-Specific activity scoring scheme (Point to one number):  0 1 2 3 4 5 6 7 8 9  10 Unable                                                                                                          Able to perform To perform  activity at the same Activity         Level as before                                                                                                                       Injury or problem Activity Initial (eval): Follow up:  Walking for extended period of time 7   2.   Driving for extended period of time  7   3.   Working around the house 5   Average 19/3 = 6.33      COGNITION: Overall cognitive status: Within functional limits for  tasks assessed  SENSATION: WFL  POSTURE: rounded shoulders and increased thoracic kyphosis  PALPATION: No overt tenderness to palpation in C spine or periscapular muscles Mild tightness in thoracolumbar paraspinals  Tender with PA mobs along lower thoracic Hypomobile throughout mid thoracic to upper thoracic   CERVICAL ROM:   Active ROM A/PROM (deg) eval  Flexion 60* (radiates to R shoulder)  Extension 40  Right lateral flexion 25*  Left lateral flexion 24*  Right rotation 40*  Left rotation 40   (Blank rows = not tested, * = pain)  UPPER EXTREMITY ROM: WNL  UPPER EXTREMITY MMT:  MMT Right eval Left eval  Shoulder flexion 5 5  Shoulder extension 5 5  Shoulder abduction 4* 5  Shoulder adduction    Shoulder extension    Shoulder internal rotation 4+ 4+  Shoulder external rotation 4 4  Middle trapezius in prone 4- 4-  Lower trapezius in prone 3- 3  Elbow flexion    Elbow extension    Wrist flexion    Wrist extension    Wrist ulnar deviation    Wrist radial deviation    Wrist pronation    Wrist supination    Grip strength     (Blank rows = not tested, * = pain)  CERVICAL SPECIAL TESTS:  Neck flexor muscle endurance test: Positive, Spurling's test: Positive, and Distraction test: Negative  FUNCTIONAL TESTS:  Did not assess  TREATMENT DATE:  09/04/23 Manual therapy STM & TPR R UT, L thoracolumbar paraspinals Grade II to III thoracic PA mobs  Therex Sidelying open/close book 5x10" CW & CCW Supine LTR 5x10" Supine cervical retraction x10  Neuromuscular re-ed Supine cervical retraction + ext x10 Supine shoulder ER green TB 2x10 Supine TSA contraction with diaphragmatic breaths 2x10 Supine "sash" with TSA contraction green TB x10 Supine "W" green TB 2x10  08/30/23 See HEP below     PATIENT EDUCATION:  Education details: Exam findings, POC, initial HEP, MRI vs not Person educated: Patient Education method: Explanation, Demonstration, and  Handouts Education comprehension: verbalized understanding, returned demonstration, and needs further education  HOME EXERCISE PROGRAM: Access Code: [email or web address] URL: [email or web address] Date: 08/30/2023 Prepared by: Sheridan Gettel April Erman [place]  Exercises - Supine Cervical Retraction with Towel  - 1 x daily - 7 x weekly - 2 sets - 10 reps - 3 sec hold - Supine Deep Neck Flexor  Training  - 1 x daily - 7 x weekly - 2 sets - 10 reps - 3 sec hold - Supine Lower Trunk Rotation  - 1 x daily - 7 x weekly - 1 sets - 5 reps - 10 sec hold - Supine Transversus Abdominis Bracing - Hands on Stomach  - 1 x daily - 7 x weekly - 2 sets - 10 reps  ASSESSMENT:  CLINICAL IMPRESSION: Reviewed HEP and progressed spinal strengthening and postural stability for improved trunk and cervical extension.   From eval: Patient is a 73 y.o. M who was seen today for physical therapy evaluation and treatment for neck pain and lumbothoracic pain. PMH is significant for fall in December from a 28ft ladder that exacerbated his symptoms. Assessment is significant for highly hypomobile thoracic spine with resultant abnormal posture, reduced cervical ROM, weak deep neck muscles with some midback and core weakness. Neck pain s/s appear most consistent with possible disc herniation while lumbothoracic pain appears most consistent to muscle spasm. Pt will benefit from PT to address these deficits for return to baseline.   OBJECTIVE IMPAIRMENTS: Abnormal gait, decreased activity tolerance, decreased coordination, decreased endurance, decreased mobility, difficulty walking, decreased ROM, decreased strength, hypomobility, increased fascial restrictions, increased muscle spasms, improper body mechanics, postural dysfunction, and pain.   ACTIVITY LIMITATIONS: carrying, lifting, standing, sleeping, and locomotion level  PARTICIPATION LIMITATIONS: cleaning, driving, shopping, community activity, and yard work  PERSONAL  FACTORS: Age, Fitness, Past/current experiences, and Time since onset of injury/illness/exacerbation are also affecting patient's functional outcome.   REHAB POTENTIAL: Good  CLINICAL DECISION MAKING: Evolving/moderate complexity  EVALUATION COMPLEXITY: Moderate   GOALS: Goals reviewed with patient? Yes  SHORT TERM GOALS: Target date: 09/20/2023   Pt will be ind with initial HEP Baseline:  Goal status: INITIAL  2.  Pt will be able to perform TSA contraction without glute/LE compensation to demo improving core strength/activation Baseline:  Goal status: INITIAL    LONG TERM GOALS: Target date: 10/11/2023   Pt will be ind with management and progression of HEP Baseline:  Goal status: INITIAL  2.  Pt will have improved PSFS average score to >/=8.33 to demo MCID Baseline:  Goal status: INITIAL  3.  Pt will be able to perform all cervical ROM without pain Baseline:  Goal status: INITIAL  4.  Pt will report >/=75% improvement in overall pain with activity Baseline:  Goal status: INITIAL  5.  Pt will be able to lift and carry at least 25# without increased lumbar or cervical strain Baseline:  Goal status: INITIAL  PLAN:  PT FREQUENCY: 1-2x/week  PT DURATION: 6 weeks  PLANNED INTERVENTIONS: 97164- PT Re-evaluation, 97750- Physical Performance Testing, 97110-Therapeutic exercises, 97530- Therapeutic activity, [id number]- Neuromuscular re-education, 97535- Self Care, [id number]- Manual therapy, [id number]- Gait training, [phone]- Aquatic Therapy, [phone]- Electrical stimulation (unattended), [id number]- Ultrasound, 56213- Traction (mechanical), [id number]- Ionotophoresis 4mg /ml Dexamethasone , Patient/Family education, Balance training, Stair training, Taping, Dry Needling, Joint mobilization, Spinal mobilization, Cryotherapy, and Moist heat  PLAN FOR NEXT SESSION: Assess response to HEP. Modify/progress accordingly. Add more thoracic and cervical extension. Core/midback, Deep neck muscle  activation/strengthening.    Jorita Bohanon April Ma L Dinita Migliaccio, PT, DPT 09/04/2023, 8:46 AM

## 2023-09-17 ENCOUNTER — Ambulatory Visit (INDEPENDENT_AMBULATORY_CARE_PROVIDER_SITE_OTHER): Admitting: Rehabilitative and Restorative Service Providers"

## 2023-09-17 ENCOUNTER — Encounter: Payer: Self-pay | Admitting: Rehabilitative and Restorative Service Providers"

## 2023-09-17 DIAGNOSIS — R2689 Other abnormalities of gait and mobility: Secondary | ICD-10-CM

## 2023-09-17 DIAGNOSIS — M542 Cervicalgia: Secondary | ICD-10-CM | POA: Diagnosis not present

## 2023-09-17 DIAGNOSIS — M6281 Muscle weakness (generalized): Secondary | ICD-10-CM

## 2023-09-17 NOTE — Therapy (Signed)
 OUTPATIENT PHYSICAL THERAPY TREATMENT   Patient Name: Sergio Nelson MRN: [id number] DOB:01/29/1951, 73 y.o., male Today's Date: 09/17/2023  END OF SESSION:  PT End of Session - 09/17/23 0935     Visit Number 3    Number of Visits 12    Date for PT Re-Evaluation 10/11/23    Authorization Type Medicare    PT Start Time 0924    PT Stop Time 1004    PT Time Calculation (min) 40 min    Activity Tolerance Patient tolerated treatment well               Past Medical History:  Diagnosis Date   Allergy    Arthritis    hands   Cataract    removed bilat 2021   GERD (gastroesophageal reflux disease)    Past Surgical History:  Procedure Laterality Date   CATARACT EXTRACTION Bilateral 2021   CHOLECYSTECTOMY N/A 10/17/2016   Procedure: LAPAROSCOPIC CHOLECYSTECTOMY WITH  INTRAOPERATIVE CHOLANGIOGRAM;  Surgeon: Caralyn Chandler, MD;  Location: [facility];  Service: General;  Laterality: N/A;   COLONOSCOPY     NERVE TRANSFER     on left elbow (ulnaar neuropathy)   POLYPECTOMY     Patient Active Problem List   Diagnosis Date Noted   Impacted cerumen of both ears 05/23/2023   Sensorineural hearing loss, bilateral 05/23/2023   Tinnitus of both ears 05/23/2023   Seasonal allergies 10/22/2017   Elevated blood pressure reading without diagnosis of hypertension 10/15/2016   Cholecystitis 10/15/2016   RUQ pain 10/12/2016   Chest pain 10/12/2016   Hyperlipidemia 10/12/2016   Routine general medical examination at a health care facility 07/15/2014    PCP: Austine Lefort, MD  REFERRING PROVIDER: Austine Lefort, MD  REFERRING DIAG: M54.9 (ICD-10-CM) - Mid back pain  THERAPY DIAG:  Cervicalgia  Muscle weakness (generalized)  Other abnormalities of gait and mobility  Rationale for Evaluation and Treatment: Rehabilitation  ONSET DATE: November 2024  SUBJECTIVE:                                                                                                                                                                                                          SUBJECTIVE STATEMENT: Pt indicated 5/10 this morning in neck before taking medicine. Felt like some increase in pain in last days since visit.     Hand dominance: Right  PERTINENT HISTORY:  Reports he has had intermittent back pains but has normally been able to self manage.  Reported no specific pain with exercises from clinic visit.  PAIN:  NPRS scale: 5/10 Pain location: neck, back Pain description: Tender to the touch Aggravating factors: Head down/bent positioning, walking for back Relieving factors: Arthritis tylenol , muscle relaxer   PRECAUTIONS: None  RED FLAGS: None     WEIGHT BEARING RESTRICTIONS: No  FALLS:  Has patient fallen in last 6 months? Yes. 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  DIAGNOSTIC FINDINGS:  08/07/23 Thoracic spine x-ray IMPRESSION: No acute fracture or dislocation. Chronic compression of T12 is identified not changed compared prior exam.  03/26/23 cervical spine x-ray IMPRESSION: No acute bony abnormality. Degenerative facet disease with multilevel bilateral neural foraminal narrowing.  03/26/23 lumbar spine x-ray IMPRESSION: Mild degenerative change without acute abnormality.  PATIENT SURVEYS:  The Patient-Specific Functional Scale  Activity Initial (eval): 08/30/2023 09/17/2023  Walking for extended period of time 7 No number given today ("haven't done')  2.   Driving for extended period of time  7 7  3.   Working around the  house 5 6  Average 19/3 = 6.33 6.5     COGNITION: 08/30/2023 Overall cognitive status: Within functional limits for tasks assessed  SENSATION: 08/30/2023 [facility]  POSTURE:  08/30/2023 rounded shoulders and increased thoracic kyphosis  PALPATION: 08/30/2023 No overt tenderness to palpation in C spine or periscapular muscles Mild tightness in thoracolumbar paraspinals  Tender with PA mobs along lower thoracic Hypomobile throughout mid thoracic to upper thoracic   CERVICAL ROM:   Active ROM A/PROM (deg) Eval 08/30/2023 [facility] 09/17/2023  Flexion 60* (radiates to R shoulder) 60  Extension 40 52  Right lateral flexion 25*   Left lateral flexion 24*   Right rotation 40* 70  Left rotation 40 60   (Blank rows = not tested, * = pain)  UPPER EXTREMITY ROM:  08/30/2023 WNL  UPPER EXTREMITY MMT:  MMT Right Eval 08/30/2023 Left Eval 08/30/2023  Shoulder flexion 5 5  Shoulder extension 5 5  Shoulder abduction 4* 5  Shoulder adduction    Shoulder extension    Shoulder internal rotation 4+ 4+  Shoulder external rotation 4 4  Middle trapezius in prone 4- 4-  Lower trapezius in prone 3- 3  Elbow flexion    Elbow extension    Wrist flexion    Wrist extension    Wrist ulnar deviation    Wrist radial deviation    Wrist pronation    Wrist supination    Grip strength     (Blank rows = not tested, * = pain)  CERVICAL SPECIAL TESTS:  08/30/2023 Neck flexor muscle endurance test: Positive, Spurling's test: Positive, and Distraction test: Negative  FUNCTIONAL TESTS:  08/30/2023 Did not assess                  TREATMENT         DATE: 09/17/2023 Therex: UBE fwd/back UE only 3 mins each way with 1 min rest between, lvl 2.5  Upper trap Rt self stretch 15 sec x 3   Review of HEP additions /adjustments with handout provided.   Neuro Re-ed(muscle activation, postural awareness/activation) Green band bilateral tband rows c scapular  retraction focus 2 x 15  Green band bilateral GH ext  2 x 15 Review of scapular retraction c ER band exercise Seated cervical isometric retraction press into ball at wall 5 sec hold x 10 (added to home)   Manual Percussive device to Rt upper trap trigger point  TREATMENT         DATE: 09/04/23 Manual therapy STM & TPR R UT, L thoracolumbar paraspinals Grade II to III thoracic PA mobs  Therex Sidelying open/close book 5x10" CW & CCW Supine LTR 5x10" Supine cervical retraction x10  Neuromuscular re-ed Supine cervical retraction + ext x10 Supine shoulder ER green TB 2x10 Supine TSA contraction with diaphragmatic breaths 2x10 Supine "sash" with TSA contraction green TB x10 Supine "W" green TB 2x10  TREATMENT         DATE:08/30/23 See HEP below     PATIENT EDUCATION:  Eval; Education details: Exam findings, POC, initial HEP, MRI vs not Person educated: Patient Education method: Explanation, Demonstration, and Handouts Education comprehension: verbalized understanding, returned demonstration, and needs further education  HOME EXERCISE PROGRAM: Access Code: [email or web address] URL: [email or web address] Date: 09/17/2023 Prepared by: Bonna Bustard  Exercises - Seated Upper Trapezius Stretch  - 2 x daily - 7 x weekly - 1 sets - 5 reps - 15 hold - Supine Cervical Retraction with Towel  - 1 x daily - 7 x weekly - 1 sets - 10 reps - 3 sec hold - Cervical Retraction at Wall  - 2 x daily - 7 x weekly - 1 sets - 5-10 reps - 5 hold - Supine Deep Neck Flexor Training  - 1 x daily - 7 x weekly - 2 sets - 10 reps - 3 sec hold - Supine Lower Trunk Rotation  - 1 x daily - 7 x weekly - 1 sets - 5 reps - 10 sec hold - Supine Transversus Abdominis Bracing - Hands on Stomach  - 1 x daily - 7 x weekly - 2 sets - 10 reps - Shoulder W - External Rotation with Resistance  - 1 x daily - 7 x weekly - 2 sets - 10 reps - Standing Bilateral Low Shoulder Row with Anchored Resistance  - 1-2 x daily - 7 x weekly - 2-3 sets - 10-15 reps - Shoulder  Extension with Resistance  - 1-2 x daily - 7 x weekly - 1-2 sets - 10-15 reps  ASSESSMENT:  CLINICAL IMPRESSION: Cervical ROM showed improvement compared to evaluation.  Based off presentation symptoms today, focus on cervical based symptoms more today.  Trigger points with myofascial tightness in Rt upper trap more than Lt with concordant symptoms were noted and treated with manual intervention.  Progressed  HEP to reflect updates in clinic.   OBJECTIVE IMPAIRMENTS: Abnormal gait, decreased activity tolerance, decreased coordination, decreased endurance, decreased mobility, difficulty walking, decreased ROM, decreased strength, hypomobility, increased fascial restrictions, increased muscle spasms, improper body mechanics, postural dysfunction, and pain.   ACTIVITY LIMITATIONS: carrying, lifting, standing, sleeping, and locomotion level  PARTICIPATION LIMITATIONS: cleaning, driving, shopping, community activity, and yard work  PERSONAL FACTORS: Age, Fitness, Past/current experiences, and Time since onset of injury/illness/exacerbation are also affecting patient's functional outcome.   REHAB POTENTIAL: Good  CLINICAL DECISION MAKING: Evolving/moderate complexity  EVALUATION COMPLEXITY: Moderate   GOALS: Goals reviewed with patient? Yes  SHORT TERM GOALS: Target date: 09/20/2023   Pt will be ind with initial HEP Baseline:  Goal status: on going 09/17/2023  2.  Pt will be able to perform TSA  contraction without glute/LE compensation to demo improving core strength/activation Baseline:  Goal status: on going 09/17/2023    LONG TERM GOALS: Target date: 10/11/2023   Pt will be ind with management and progression of HEP Baseline:  Goal status: INITIAL  2.  Pt will have improved PSFS average score to >/=8.33 to demo MCID Baseline:  Goal status: INITIAL  3.  Pt will be able to perform all cervical ROM without pain Baseline:  Goal status: INITIAL  4.  Pt will report >/=75%  improvement in overall pain with activity Baseline:  Goal status: INITIAL  5.  Pt will be able to lift and carry at least 25# without increased lumbar or cervical strain Baseline:  Goal status: INITIAL  PLAN:  PT FREQUENCY: 1-2x/week  PT DURATION: 6 weeks  PLANNED INTERVENTIONS: 97164- PT Re-evaluation, 97750- Physical Performance Testing, 97110-Therapeutic exercises, 97530- Therapeutic activity, [id number]- Neuromuscular re-education, 97535- Self Care, 10960- Manual therapy, [id number]- Gait training, [phone]- Aquatic Therapy, [phone]- Electrical stimulation (unattended), [id number]- Ultrasound, 47829- Traction (mechanical), [id number]- Ionotophoresis 4mg /ml Dexamethasone , Patient/Family education, Balance training, Stair training, Taping, Dry Needling, Joint mobilization, Spinal mobilization, Cryotherapy, and Moist heat  PLAN FOR NEXT SESSION: Recheck new HEP adjustments, STG/LTG reassessment.    Bonna Bustard, PT, DPT, OCS, ATC 09/17/23  10:05 AM

## 2023-09-21 ENCOUNTER — Encounter: Payer: Self-pay | Admitting: Physical Therapy

## 2023-09-21 ENCOUNTER — Ambulatory Visit: Admitting: Physical Therapy

## 2023-09-21 DIAGNOSIS — M6281 Muscle weakness (generalized): Secondary | ICD-10-CM

## 2023-09-21 DIAGNOSIS — R2689 Other abnormalities of gait and mobility: Secondary | ICD-10-CM | POA: Diagnosis not present

## 2023-09-21 DIAGNOSIS — M542 Cervicalgia: Secondary | ICD-10-CM | POA: Diagnosis not present

## 2023-09-21 DIAGNOSIS — M549 Dorsalgia, unspecified: Secondary | ICD-10-CM | POA: Diagnosis not present

## 2023-09-21 NOTE — Therapy (Signed)
 OUTPATIENT PHYSICAL THERAPY TREATMENT   Patient Name: Sergio Nelson MRN: [id number] DOB:[birth year], 73 y.o., male Today's Date: 09/21/2023  END OF SESSION:  PT End of Session - 09/21/23 0925     Visit Number 4    Number of Visits 12    Date for PT Re-Evaluation 10/11/23    Authorization Type Medicare    PT Start Time 0925    PT Stop Time 1005    PT Time Calculation (min) 40 min    Activity Tolerance Patient tolerated treatment well             Past Medical History:  Diagnosis Date   Allergy    Arthritis    hands   Cataract    removed bilat 2021   GERD (gastroesophageal reflux disease)    Past Surgical History:  Procedure Laterality Date   CATARACT EXTRACTION Bilateral 2021   CHOLECYSTECTOMY N/A 10/17/2016   Procedure: LAPAROSCOPIC CHOLECYSTECTOMY WITH  INTRAOPERATIVE CHOLANGIOGRAM;  Surgeon: Caralyn Chandler, MD;  Location: [facility];  Service: General;  Laterality: N/A;   COLONOSCOPY     NERVE TRANSFER     on left elbow (ulnaar neuropathy)   POLYPECTOMY     Patient Active Problem List   Diagnosis Date Noted   Impacted cerumen of both ears 05/23/2023   Sensorineural hearing loss, bilateral 05/23/2023   Tinnitus of both ears 05/23/2023   Seasonal allergies 10/22/2017   Elevated blood pressure reading without diagnosis of hypertension 10/15/2016   Cholecystitis 10/15/2016   RUQ pain 10/12/2016   Chest pain 10/12/2016   Hyperlipidemia 10/12/2016   Routine general medical examination at a health care facility 07/15/2014    PCP: Austine Lefort, MD  REFERRING PROVIDER: Austine Lefort, MD  REFERRING DIAG: M54.9 (ICD-10-CM) - Mid back pain  THERAPY DIAG:  Cervicalgia  Muscle weakness (generalized)  Other abnormalities of gait and mobility  Mid back pain  Rationale for Evaluation and Treatment: Rehabilitation  ONSET DATE: November 2024  SUBJECTIVE:                                                                                                                                                                                                          SUBJECTIVE STATEMENT: "Today is not bad." Pt reports it was not a good week. Pt states he was in the bed -- his neck was killing him. Was able to do his stuff. Pt states the back has not been too bad.   Hand dominance: Right  PERTINENT HISTORY:  Reports he has had intermittent back  pains but has normally been able to self manage.  Reported no specific pain with exercises from clinic visit.   PAIN:  NPRS scale: 3/10 Pain location: neck, back Pain description: Tender to the touch Aggravating factors: Head down/bent positioning, walking for back Relieving factors: Arthritis tylenol , muscle relaxer   PRECAUTIONS: None  RED FLAGS: None     WEIGHT BEARING RESTRICTIONS: No  FALLS:  Has patient fallen in last 6 months? Yes. 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  DIAGNOSTIC FINDINGS:  08/07/23 Thoracic spine x-ray IMPRESSION: No acute fracture or dislocation. Chronic compression of T12 is identified not changed compared prior exam.  03/26/23 cervical spine x-ray IMPRESSION: No acute bony abnormality. Degenerative facet disease with multilevel bilateral neural foraminal narrowing.  03/26/23 lumbar spine x-ray IMPRESSION: Mild degenerative change without acute abnormality.  PATIENT SURVEYS:  The Patient-Specific Functional Scale  Activity Initial (eval): 08/30/2023 09/17/2023  Walking for extended period of time 7 No number given today ("haven't done')  2.    Driving for extended period of time  7 7  3.   Working around the house 5 6  Average 19/3 = 6.33 6.5    POSTURE:  08/30/2023 rounded shoulders and increased thoracic kyphosis  PALPATION: 08/30/2023 No overt tenderness to palpation in C spine or periscapular muscles Mild tightness in thoracolumbar paraspinals  Tender with PA mobs along lower thoracic Hypomobile throughout mid thoracic to upper thoracic   CERVICAL ROM:   Active ROM A/PROM (deg) Eval 08/30/2023 AROM 09/17/2023  Flexion 60* (radiates to R shoulder) 60  Extension 40 52  Right lateral flexion 25*   Left lateral flexion 24*   Right rotation 40* 70  Left rotation 40 60   (Blank rows = not tested, * = pain)  UPPER EXTREMITY ROM:  08/30/2023 WNL  UPPER EXTREMITY MMT:  MMT Right Eval 08/30/2023 Left Eval 08/30/2023  Shoulder flexion 5 5  Shoulder extension 5 5  Shoulder abduction 4* 5  Shoulder adduction    Shoulder internal rotation 4+ 4+  Shoulder external rotation 4 4  Middle trapezius in prone 4- 4-  Lower trapezius in prone 3- 3   (Blank rows = not tested, * = pain)  CERVICAL SPECIAL TESTS:  08/30/2023 Neck flexor muscle endurance test: Positive, Spurling's test: Positive, and Distraction test: Negative  FUNCTIONAL TESTS:  08/30/2023 Did not assess                 TREATMENT         DATE: 09/21/2023 Therex: UBE fwd/back UE only 3 mins each way with 1 min rest between, lvl 2.5  Standing doorway pec stretch low, mid, high x30" each Upper trap Rt self stretch 2x30" Levator scap stretch 2x30" Seated thoracic ext x10  Neuro Re-ed Seated Cervical retraction 2x10 Cervical retraction + ext x10 Cervical retraction + rotation x10 "W" 2x10 Horizontal shoulder abd 90/90 2x10 Attempted serratus anterior + shoulder flexion   TREATMENT         DATE: 09/17/2023 Therex: UBE fwd/back UE only 3 mins each way with 1 min rest between, lvl 2.5  Upper trap Rt self stretch 15 sec x 3   Review of HEP additions  /adjustments with handout provided.   Neuro Re-ed(muscle activation, postural awareness/activation) Green band bilateral tband rows c scapular retraction focus 2 x 15  Green band bilateral GH ext 2 x 15 Review of scapular retraction c ER band exercise Seated cervical isometric retraction press into ball at wall 5 sec hold x 10 (added to home)  Manual Percussive device to Rt upper trap trigger point  TREATMENT         DATE: 09/04/23 Manual therapy STM & TPR R UT, L thoracolumbar paraspinals Grade II to III thoracic PA mobs  Therex Sidelying open/close book 5x10" CW & CCW Supine LTR 5x10" Supine cervical retraction x10  Neuromuscular re-ed Supine cervical retraction + ext x10 Supine shoulder ER green TB 2x10 Supine TSA contraction with diaphragmatic breaths 2x10 Supine "sash" with TSA contraction green TB x10 Supine "W" green TB 2x10  TREATMENT         DATE:08/30/23 See HEP below     PATIENT EDUCATION:  Eval; Education details: Exam findings, POC, initial HEP, MRI vs not Person educated: Patient Education method: Explanation, Demonstration, and Handouts Education comprehension: verbalized understanding, returned demonstration, and needs further education  HOME EXERCISE PROGRAM: Access Code: [email or web address] URL: [email or web address] Date: 09/17/2023 Prepared by: Bonna Bustard  Exercises - Seated Upper Trapezius Stretch  - 2 x daily - 7 x weekly - 1 sets - 5 reps - 15 hold - Supine Cervical Retraction with Towel  - 1 x daily - 7 x weekly - 1 sets - 10 reps - 3 sec hold - Cervical Retraction at Wall  - 2 x daily - 7 x weekly - 1 sets - 5-10 reps - 5 hold - Supine Deep Neck Flexor Training  - 1 x daily - 7 x weekly - 2 sets - 10 reps - 3 sec hold - Supine Lower Trunk Rotation  - 1 x daily - 7 x weekly - 1 sets - 5 reps - 10 sec hold - Supine Transversus Abdominis Bracing - Hands on Stomach  - 1 x daily - 7 x weekly - 2 sets - 10 reps - Shoulder W - External  Rotation with Resistance  - 1 x daily - 7 x weekly - 2 sets - 10 reps - Standing Bilateral Low Shoulder Row with Anchored Resistance  - 1-2 x daily - 7 x weekly - 2-3 sets - 10-15 reps - Shoulder Extension with Resistance  - 1-2 x daily - 7 x weekly - 1-2 sets - 10-15 reps  ASSESSMENT:  CLINICAL IMPRESSION: Pt reports his neck felt better by end of session. Continued to work on neck stretching to improve pt's muscle tightness and progressing deep neck muscle strengthening. Attempted to perform serratus anterior strengthening with shoulder flexion; however, pt getting a pull in his neck. Tolerated midback strengthening well. Will benefit from working on shoulder strengthening/overhead lifting with focus on reducing neck strain. Did not change HEP as pt states he has not been able to do too much of it.   OBJECTIVE IMPAIRMENTS: Abnormal gait, decreased activity tolerance, decreased coordination, decreased endurance, decreased mobility, difficulty walking, decreased ROM, decreased strength, hypomobility, increased fascial restrictions, increased muscle spasms, improper body mechanics, postural dysfunction, and pain.    GOALS: Goals reviewed with patient? Yes  SHORT TERM GOALS: Target date: 09/20/2023   Pt will be ind with initial HEP Baseline:  Goal status: on going 09/17/2023  2.  Pt will  be able to perform TSA contraction without glute/LE compensation to demo improving core strength/activation Baseline:  Goal status: on going 09/17/2023    LONG TERM GOALS: Target date: 10/11/2023   Pt will be ind with management and progression of HEP Baseline:  Goal status: INITIAL  2.  Pt will have improved PSFS average score to >/=8.33 to demo MCID Baseline:  Goal status: INITIAL  3.  Pt will be able to perform all cervical ROM without pain Baseline:  Goal status: INITIAL  4.  Pt will report >/=75% improvement in overall pain with activity Baseline:  Goal status: INITIAL  5.  Pt will be able  to lift and carry at least 25# without increased lumbar or cervical strain Baseline:  Goal status: INITIAL  PLAN:  PT FREQUENCY: 1-2x/week  PT DURATION: 6 weeks  PLANNED INTERVENTIONS: 97164- PT Re-evaluation, 97750- Physical Performance Testing, 97110-Therapeutic exercises, 97530- Therapeutic activity, [id number]- Neuromuscular re-education, 97535- Self Care, [id number]- Manual therapy, [id number]- Gait training, [phone]- Aquatic Therapy, [phone]- Electrical stimulation (unattended), [id number]- Ultrasound, 21308- Traction (mechanical), [id number]- Ionotophoresis 4mg /ml Dexamethasone , Patient/Family education, Balance training, Stair training, Taping, Dry Needling, Joint mobilization, Spinal mobilization, Cryotherapy, and Moist heat  PLAN FOR NEXT SESSION: Recheck new HEP adjustments, STG/LTG reassessment.    Sajjad Honea April Ma L Brittane Grudzinski, PT, DPT 09/21/23  9:25 AM

## 2023-09-25 ENCOUNTER — Ambulatory Visit (INDEPENDENT_AMBULATORY_CARE_PROVIDER_SITE_OTHER): Admitting: Rehabilitative and Restorative Service Providers"

## 2023-09-25 ENCOUNTER — Encounter: Payer: Self-pay | Admitting: Rehabilitative and Restorative Service Providers"

## 2023-09-25 DIAGNOSIS — M549 Dorsalgia, unspecified: Secondary | ICD-10-CM | POA: Diagnosis not present

## 2023-09-25 NOTE — Therapy (Signed)
 OUTPATIENT PHYSICAL THERAPY TREATMENT   Patient Name: Sergio Nelson MRN: [id number] DOB:[birth year], 73 y.o., male Today's Date: 09/25/2023  END OF SESSION:  PT End of Session - 09/25/23 1502     Visit Number 5    Number of Visits 12    Date for PT Re-Evaluation 10/11/23    Authorization Type Medicare    Progress Note Due on Visit 10    PT Start Time 1515    PT Stop Time 1555    PT Time Calculation (min) 40 min    Activity Tolerance Patient tolerated treatment well    Behavior During Therapy [facility] for tasks assessed/performed              Past Medical History:  Diagnosis Date   Allergy    Arthritis    hands   Cataract    removed bilat 2021   GERD (gastroesophageal reflux disease)    Past Surgical History:  Procedure Laterality Date   CATARACT EXTRACTION Bilateral 2021   CHOLECYSTECTOMY N/A 10/17/2016   Procedure: LAPAROSCOPIC CHOLECYSTECTOMY WITH  INTRAOPERATIVE CHOLANGIOGRAM;  Surgeon: Caralyn Chandler, MD;  Location: [facility];  Service: General;  Laterality: N/A;   COLONOSCOPY     NERVE TRANSFER     on left elbow (ulnaar neuropathy)   POLYPECTOMY     Patient Active Problem List   Diagnosis Date Noted   Impacted cerumen of both ears 05/23/2023   Sensorineural hearing loss, bilateral 05/23/2023   Tinnitus of both ears 05/23/2023   Seasonal allergies 10/22/2017   Elevated blood pressure reading without diagnosis of hypertension 10/15/2016   Cholecystitis 10/15/2016   RUQ pain 10/12/2016   Chest pain 10/12/2016   Hyperlipidemia 10/12/2016   Routine general medical examination at a health care facility 07/15/2014    PCP: Austine Lefort, MD  REFERRING PROVIDER: Austine Lefort, MD  REFERRING DIAG: M54.9 (ICD-10-CM) - Mid back pain  THERAPY DIAG:  Mid back pain  Rationale for Evaluation and Treatment: Rehabilitation  ONSET DATE: November 2024  SUBJECTIVE:                                                                                                                                                                                                          SUBJECTIVE STATEMENT: Pt indicated pain severity has been lower. Has taken medicine still but trying to take less. Pt indicated overall improvement to normal around 75-80%.   Hand dominance: Right  PERTINENT HISTORY:  Reports he has had intermittent back pains but has normally been able to self manage.  Reported no specific pain with exercises from clinic visit.    PAIN:  NPRS scale: at most 2/10 in last few days.  Pain location: neck, back Pain description: Tender to the touch Aggravating factors: Head down/bent positioning, walking for back Relieving factors: Arthritis tylenol , muscle relaxer   PRECAUTIONS: None  RED FLAGS: None     WEIGHT BEARING RESTRICTIONS: No  FALLS:  Has patient fallen in last 6 months? Yes. 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  DIAGNOSTIC FINDINGS:  08/07/23 Thoracic spine x-ray IMPRESSION: No acute fracture or dislocation. Chronic compression of T12 is identified not changed compared prior exam.  03/26/23 cervical spine x-ray IMPRESSION: No acute bony abnormality. Degenerative facet disease with multilevel bilateral neural foraminal narrowing.  03/26/23 lumbar spine x-ray IMPRESSION: Mild degenerative change without acute abnormality.  PATIENT SURVEYS:  The Patient-Specific Functional Scale  Activity Initial (eval): 08/30/2023 09/17/2023  Walking for extended period of time 7 No number given today ("haven't done')   2.   Driving for extended period of time  7 7  3.   Working around the house 5 6  Average 19/3 = 6.33 6.5    POSTURE:  08/30/2023 rounded shoulders and increased thoracic kyphosis  PALPATION: 08/30/2023 No overt tenderness to palpation in C spine or periscapular muscles Mild tightness in thoracolumbar paraspinals  Tender with PA mobs along lower thoracic Hypomobile throughout mid thoracic to upper thoracic   CERVICAL ROM:   Active ROM A/PROM (deg) Eval 08/30/2023 AROM 09/17/2023 AROM 09/25/2023  Flexion 60* (radiates to R shoulder) 60 62  Extension 40 52 53  Right lateral flexion 25*    Left lateral flexion 24*    Right rotation 40* 70 68 ( 72 post manual)  Left rotation 40 60 68 (72  post manual)   (Blank rows = not tested, * = pain)  UPPER EXTREMITY ROM:  08/30/2023 WNL  UPPER EXTREMITY MMT:  MMT Right Eval 08/30/2023 Left Eval 08/30/2023  Shoulder flexion 5 5  Shoulder extension 5 5  Shoulder abduction 4* 5  Shoulder adduction    Shoulder internal rotation 4+ 4+  Shoulder external rotation 4 4  Middle trapezius in prone 4- 4-  Lower trapezius in prone 3- 3   (Blank rows = not tested, * = pain)  CERVICAL SPECIAL TESTS:  08/30/2023 Neck flexor muscle endurance test: Positive, Spurling's test: Positive, and Distraction test: Negative  FUNCTIONAL TESTS:  08/30/2023 Did not assess                  TREATMENT         DATE: 09/25/2023 Therex: UBE fwd/back UE only 4 mins each way with 1 min rest between, lvl 3.0  Supine cervical rotation x 10 bilateral.  Seated cervical rotation towel assisted 5 sec hold x 5 bilateral  Seated upper trap stretch 15 sec x 3 bilateral Seated thoracic extension over chair 2-3 sec hold x 10  Time spent in technique education and performance.   Neuro Re-ed(muscle activation, postural awareness/activation) Supine cervical retraction into pillow isometric 5 sec hold x 10  Supine scapular  retraction into table isometric 5 sec hold x 10   Supine bilateral gh ext isometric into table 5 sec hold x 10  Seated bilateral shoudler ER c scapular retraction green band 2 x 10  Time spent in technique education and performance.   Manual Supine cervical downslope mobs g3 C3-C7 bilaterally for joint mobility improvements.    TREATMENT         DATE: 09/21/2023 Therex: UBE fwd/back UE only 3 mins each way with 1 min rest between, lvl 2.5  Standing doorway pec stretch low, mid, high x30" each Upper trap Rt self stretch 2x30" Levator scap stretch 2x30" Seated thoracic ext x10  Neuro Re-ed Seated Cervical retraction 2x10 Cervical retraction + ext x10 Cervical retraction + rotation x10 "W" 2x10 Horizontal shoulder abd 90/90 2x10 Attempted serratus anterior + shoulder flexion   TREATMENT         DATE: 09/17/2023 Therex: UBE fwd/back UE only 3 mins each way with 1 min rest between, lvl 2.5  Upper trap Rt self stretch 15 sec x 3   Review of HEP additions /adjustments with handout provided.   Neuro Re-ed(muscle activation, postural awareness/activation) Green band bilateral tband rows c scapular retraction focus 2 x 15  Green band bilateral GH ext 2 x 15 Review of scapular retraction c ER band exercise Seated cervical isometric retraction press into ball at wall 5 sec hold x 10 (added to home)  Manual Percussive device to Rt upper trap trigger point  TREATMENT         DATE: 09/04/23 Manual therapy STM & TPR R UT, L thoracolumbar paraspinals Grade II to III thoracic PA mobs  Therex Sidelying open/close book 5x10" CW & CCW Supine LTR 5x10" Supine cervical retraction x10  Neuromuscular re-ed Supine cervical retraction + ext x10 Supine shoulder ER green TB 2x10 Supine TSA contraction with diaphragmatic breaths 2x10 Supine "sash" with TSA contraction green TB x10 Supine "W" green TB 2x10  TREATMENT         DATE:08/30/23 See HEP below     PATIENT EDUCATION:  Eval; Education details: Exam findings, POC, initial HEP, MRI  vs not Person educated: Patient Education method: Explanation, Demonstration, and Handouts Education comprehension: verbalized understanding, returned demonstration, and needs further education  HOME EXERCISE PROGRAM: Access Code: [email or web address] URL: [email or web address] Date: 09/17/2023 Prepared by: Bonna Bustard  Exercises - Seated Upper Trapezius Stretch  - 2 x daily - 7 x weekly - 1 sets - 5 reps - 15 hold - Supine Cervical Retraction with Towel  - 1 x daily - 7 x weekly - 1 sets - 10 reps - 3 sec hold - Cervical Retraction at Wall  - 2 x daily - 7 x weekly - 1 sets - 5-10 reps - 5 hold - Supine Deep Neck Flexor Training  - 1 x daily - 7 x weekly - 2 sets - 10 reps - 3 sec hold - Supine Lower Trunk Rotation  - 1 x daily - 7 x weekly - 1 sets - 5 reps - 10 sec hold - Supine Transversus Abdominis Bracing - Hands on Stomach  - 1 x daily - 7 x weekly - 2 sets - 10 reps - Shoulder W - External Rotation with Resistance  - 1 x daily - 7 x weekly - 2 sets - 10 reps - Standing Bilateral Low Shoulder Row with Anchored Resistance  - 1-2 x daily - 7 x weekly - 2-3 sets - 10-15 reps - Shoulder Extension with Resistance  -  1-2 x daily - 7 x weekly - 1-2 sets - 10-15 reps  ASSESSMENT:  CLINICAL IMPRESSION: Cervical range continued to show steady improvements in mobility.  May continue to benefit from progressive mobility gains in cervical and back range of motion.   OBJECTIVE IMPAIRMENTS: Abnormal gait, decreased activity tolerance, decreased coordination, decreased endurance, decreased mobility, difficulty walking, decreased ROM, decreased strength, hypomobility, increased fascial restrictions, increased muscle spasms, improper body mechanics, postural dysfunction, and pain.    GOALS: Goals reviewed with patient? Yes  SHORT TERM GOALS: Target date: 09/20/2023   Pt will be ind with initial HEP Baseline:  Goal status: mostly met  2.  Pt will be able to perform TSA contraction  without glute/LE compensation to demo improving core strength/activation Baseline:  Goal status: met    LONG TERM GOALS: Target date: 10/11/2023   Pt will be ind with management and progression of HEP Baseline:  Goal status: on going 09/25/2023  2.  Pt will have improved PSFS average score to >/=8.33 to demo MCID Baseline:  Goal status: on going 09/25/2023  3.  Pt will be able to perform all cervical ROM without pain Baseline:  Goal status: on going 09/25/2023  4.  Pt will report >/=75% improvement in overall pain with activity Baseline:  Goal status: on going 09/25/2023  5.  Pt will be able to lift and carry at least 25# without increased lumbar or cervical strain Baseline:  Goal status: on going 09/25/2023  PLAN:  PT FREQUENCY: 1-2x/week  PT DURATION: 6 weeks  PLANNED INTERVENTIONS: 97164- PT Re-evaluation, 97750- Physical Performance Testing, 97110-Therapeutic exercises, 97530- Therapeutic activity, [id number]- Neuromuscular re-education, 97535- Self Care, 16109- Manual therapy, [id number]- Gait training, [phone]- Aquatic Therapy, [phone]- Electrical stimulation (unattended), [id number]- Ultrasound, 91478- Traction (mechanical), [id number]- Ionotophoresis 4mg /ml Dexamethasone , Patient/Family education, Balance training, Stair training, Taping, Dry Needling, Joint mobilization, Spinal mobilization, Cryotherapy, and Moist heat  PLAN FOR NEXT SESSION: Recert date upcoming on 10/11/2023.    Bonna Bustard, PT, DPT, OCS, ATC 09/25/23  3:56 PM

## 2023-09-27 ENCOUNTER — Other Ambulatory Visit: Payer: Self-pay | Admitting: Family Medicine

## 2023-09-27 ENCOUNTER — Ambulatory Visit (INDEPENDENT_AMBULATORY_CARE_PROVIDER_SITE_OTHER): Admitting: Rehabilitative and Restorative Service Providers"

## 2023-09-27 ENCOUNTER — Encounter: Payer: Self-pay | Admitting: Rehabilitative and Restorative Service Providers"

## 2023-09-27 DIAGNOSIS — M549 Dorsalgia, unspecified: Secondary | ICD-10-CM | POA: Diagnosis not present

## 2023-09-27 NOTE — Therapy (Signed)
 OUTPATIENT PHYSICAL THERAPY TREATMENT   Patient Name: Sergio Nelson MRN: [id number] DOB:May 15, 1950, 73 y.o., male Today's Date: 09/27/2023  END OF SESSION:  PT End of Session - 09/27/23 1303     Visit Number 6    Number of Visits 12    Date for PT Re-Evaluation 10/11/23    Authorization Type Medicare    Progress Note Due on Visit 10    PT Start Time 1257    PT Stop Time 1336    PT Time Calculation (min) 39 min    Activity Tolerance Patient tolerated treatment well    Behavior During Therapy [facility] for tasks assessed/performed            Past Medical History:  Diagnosis Date   Allergy    Arthritis    hands   Cataract    removed bilat 2021   GERD (gastroesophageal reflux disease)    Past Surgical History:  Procedure Laterality Date   CATARACT EXTRACTION Bilateral 2021   CHOLECYSTECTOMY N/A 10/17/2016   Procedure: LAPAROSCOPIC CHOLECYSTECTOMY WITH  INTRAOPERATIVE CHOLANGIOGRAM;  Surgeon: Caralyn Chandler, MD;  Location: [facility];  Service: General;  Laterality: N/A;   COLONOSCOPY     NERVE TRANSFER     on left elbow (ulnaar neuropathy)   POLYPECTOMY     Patient Active Problem List   Diagnosis Date Noted   Impacted cerumen of both ears 05/23/2023   Sensorineural hearing loss, bilateral 05/23/2023   Tinnitus of both ears 05/23/2023   Seasonal allergies 10/22/2017   Elevated blood pressure reading without diagnosis of hypertension 10/15/2016   Cholecystitis 10/15/2016   RUQ pain 10/12/2016   Chest pain 10/12/2016   Hyperlipidemia 10/12/2016   Routine general medical examination at a health care facility 07/15/2014    PCP: Austine Lefort, MD  REFERRING PROVIDER: Austine Lefort, MD  REFERRING DIAG: M54.9 (ICD-10-CM) - Mid back pain  THERAPY DIAG:  Mid back pain  Rationale for Evaluation and Treatment: Rehabilitation  ONSET DATE: November 2024  SUBJECTIVE:                                                                                                                                                                                                          SUBJECTIVE STATEMENT: Pt indicated feeling a little sore after last visit and with movement today.  Reported no other specific pain today.   Hand dominance: Right  PERTINENT HISTORY:  Reports he has had intermittent back pains but has normally been able to self manage.  Reported no specific pain with  exercises from clinic visit.    PAIN:  NPRS scale: at most 2/10 in last few days.  Pain location: neck, back Pain description: Tender to the touch Aggravating factors: Head down/bent positioning, walking for back Relieving factors: Arthritis tylenol , muscle relaxer   PRECAUTIONS: None  RED FLAGS: None     WEIGHT BEARING RESTRICTIONS: No  FALLS:  Has patient fallen in last 6 months? Yes. 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  DIAGNOSTIC FINDINGS:  08/07/23 Thoracic spine x-ray IMPRESSION: No acute fracture or dislocation. Chronic compression of T12 is identified not changed compared prior exam.  03/26/23 cervical spine x-ray IMPRESSION: No acute bony abnormality. Degenerative facet disease with multilevel bilateral neural foraminal narrowing.  03/26/23 lumbar spine x-ray IMPRESSION: Mild degenerative change without acute abnormality.  PATIENT SURVEYS:  The Patient-Specific Functional Scale  Activity Initial (eval): 08/30/2023 09/17/2023  Walking for extended period of time 7 No number given today (haven't done')  2.   Driving for extended period  of time  7 7  3.   Working around the house 5 6  Average 19/3 = 6.33 6.5    POSTURE:  08/30/2023 rounded shoulders and increased thoracic kyphosis  PALPATION: 08/30/2023 No overt tenderness to palpation in C spine or periscapular muscles Mild tightness in thoracolumbar paraspinals  Tender with PA mobs along lower thoracic Hypomobile throughout mid thoracic to upper thoracic   CERVICAL ROM:   Active ROM A/PROM (deg) Eval 08/30/2023 AROM 09/17/2023 AROM 09/25/2023  Flexion 60* (radiates to R shoulder) 60 62  Extension 40 52 53  Right lateral flexion 25*    Left lateral flexion 24*    Right rotation 40* 70 68 ( 72 post manual)  Left rotation 40 60 68 (72  post manual)   (Blank rows = not tested, * = pain)  UPPER EXTREMITY ROM:  08/30/2023 WNL  UPPER EXTREMITY MMT:  MMT Right Eval 08/30/2023 Left Eval 08/30/2023  Shoulder flexion 5 5  Shoulder extension 5 5  Shoulder abduction 4* 5  Shoulder adduction    Shoulder internal rotation 4+ 4+  Shoulder external rotation 4 4  Middle trapezius in prone 4- 4-  Lower trapezius in prone 3- 3   (Blank rows = not tested, * = pain)  CERVICAL SPECIAL TESTS:  08/30/2023 Neck flexor muscle endurance test: Positive, Spurling's test: Positive, and Distraction test: Negative  FUNCTIONAL TESTS:  08/30/2023 Did not assess                  TREATMENT         DATE: 09/27/2023 Therex: UBE fwd/back UE only 4 mins each way with 1 min rest between, lvl 3.0  Seated cervical rotation towel assisted 5 sec hold x 10 bilateral  Cervical extension towel assisted 2-3 sec hold x 10  Seated upper trap stretch 15 sec x 3 bilateral Seated thoracic extension over chair 2-3 sec hold x 10    Neuro Re-ed(muscle activation, postural awareness/activation) Standing green band bilateral shoulder rows with scapular retraction focus 2 x 15  Standing green band gh ext bilateral 2 x 15  Seated bilateral shoudler ER c scapular retraction green band 2 x  15 Supine cervical retraction isometric hold 5 sec x 10 Supine horizontal abduction green band 2 x 15   TREATMENT         DATE: 09/25/2023 Therex: UBE fwd/back UE only 4 mins each way with 1 min rest between, lvl 3.0  Supine cervical rotation x 10 bilateral.  Seated cervical rotation towel assisted 5 sec hold x 5 bilateral  Seated upper trap stretch 15 sec x 3 bilateral Seated thoracic extension over chair 2-3 sec hold x 10  Time spent in technique education and performance.   Neuro Re-ed(muscle activation, postural awareness/activation) Supine cervical retraction into pillow isometric 5 sec hold x 10  Supine scapular retraction into table isometric 5 sec hold x 10  Supine bilateral gh ext isometric into table 5 sec hold x 10  Seated bilateral shoudler ER c scapular retraction green band 2 x 10  Time spent in technique education and performance.   Manual Supine cervical downslope mobs g3 C3-C7 bilaterally for joint mobility improvements.    TREATMENT         DATE: 09/21/2023 Therex: UBE fwd/back UE only 3 mins each way with 1 min rest between, lvl 2.5  Standing doorway pec stretch low, mid, high x30 each Upper trap Rt self stretch 2x30 Levator scap stretch 2x30 Seated thoracic ext x10  Neuro Re-ed Seated Cervical retraction 2x10 Cervical retraction + ext x10 Cervical retraction + rotation x10 W 2x10 Horizontal shoulder abd 90/90 2x10 Attempted serratus anterior + shoulder flexion   TREATMENT         DATE: 09/17/2023 Therex: UBE fwd/back UE only 3 mins each way with 1 min rest between, lvl 2.5  Upper trap Rt self stretch 15 sec x 3   Review of HEP additions /adjustments with handout provided.   Neuro Re-ed(muscle activation, postural awareness/activation) Green band bilateral tband rows c scapular retraction focus 2 x 15  Green band bilateral GH ext 2 x 15 Review of scapular retraction c ER band exercise Seated cervical isometric retraction press into ball at wall  5 sec hold x 10 (added to home)  Manual Percussive device to Rt upper trap trigger point  PATIENT EDUCATION:  Eval; Education details: Exam findings, POC, initial HEP, MRI vs not Person educated: Patient Education method: Explanation, Demonstration, and Handouts Education comprehension: verbalized understanding, returned demonstration, and needs further education  HOME EXERCISE PROGRAM: Access Code: [email or web address] URL: [email or web address] Date: 09/17/2023 Prepared by: Bonna Bustard  Exercises - Seated Upper Trapezius Stretch  - 2 x daily - 7 x weekly - 1 sets - 5 reps - 15 hold - Supine Cervical Retraction with Towel  - 1 x daily - 7 x weekly - 1 sets - 10 reps - 3 sec hold - Cervical Retraction at Wall  - 2 x daily - 7 x weekly - 1 sets - 5-10 reps - 5 hold - Supine Deep Neck Flexor Training  - 1 x daily - 7 x weekly - 2 sets - 10 reps - 3 sec hold - Supine Lower Trunk Rotation  - 1 x daily - 7 x weekly - 1 sets - 5 reps - 10 sec hold - Supine Transversus Abdominis Bracing - Hands on Stomach  - 1 x daily - 7 x weekly - 2 sets - 10 reps - Shoulder W - External Rotation with Resistance  - 1 x daily - 7 x weekly - 2 sets - 10 reps -  Standing Bilateral Low Shoulder Row with Anchored Resistance  - 1-2 x daily - 7 x weekly - 2-3 sets - 10-15 reps - Shoulder Extension with Resistance  - 1-2 x daily - 7 x weekly - 1-2 sets - 10-15 reps  ASSESSMENT:  CLINICAL IMPRESSION: Quality of cervical range held today from last visit.  Mild discomfort noted in end range but end range continued to show improvement. Pt may continue to benefit from variety of mobility intervention to promote joint and myofascial mobility gains.   OBJECTIVE IMPAIRMENTS: Abnormal gait, decreased activity tolerance, decreased coordination, decreased endurance, decreased mobility, difficulty walking, decreased ROM, decreased strength, hypomobility, increased fascial restrictions, increased muscle spasms, improper  body mechanics, postural dysfunction, and pain.    GOALS: Goals reviewed with patient? Yes  SHORT TERM GOALS: Target date: 09/20/2023   Pt will be ind with initial HEP Baseline:  Goal status: mostly met  2.  Pt will be able to perform TSA contraction without glute/LE compensation to demo improving core strength/activation Baseline:  Goal status: met    LONG TERM GOALS: Target date: 10/11/2023   Pt will be ind with management and progression of HEP Baseline:  Goal status: on going 09/25/2023  2.  Pt will have improved PSFS average score to >/=8.33 to demo MCID Baseline:  Goal status: on going 09/25/2023  3.  Pt will be able to perform all cervical ROM without pain Baseline:  Goal status: on going 09/25/2023  4.  Pt will report >/=75% improvement in overall pain with activity Baseline:  Goal status: on going 09/25/2023  5.  Pt will be able to lift and carry at least 25# without increased lumbar or cervical strain Baseline:  Goal status: on going 09/25/2023  PLAN:  PT FREQUENCY: 1-2x/week  PT DURATION: 6 weeks  PLANNED INTERVENTIONS: 97164- PT Re-evaluation, 97750- Physical Performance Testing, 97110-Therapeutic exercises, 97530- Therapeutic activity, [id number]- Neuromuscular re-education, 97535- Self Care, 30865- Manual therapy, [id number]- Gait training, [phone]- Aquatic Therapy, [phone]- Electrical stimulation (unattended), [id number]- Ultrasound, 84132- Traction (mechanical), [id number]- Ionotophoresis 4mg /ml Dexamethasone , Patient/Family education, Balance training, Stair training, Taping, Dry Needling, Joint mobilization, Spinal mobilization, Cryotherapy, and Moist heat  PLAN FOR NEXT SESSION: Continued to improve cervical mobility.  Recert date upcoming on 10/11/2023.    Bonna Bustard, PT, DPT, OCS, ATC 09/27/23  1:34 PM

## 2023-10-01 NOTE — Addendum Note (Signed)
 Addended by: Darylene Epley MARIE L on: 10/01/2023 08:20 AM   Modules accepted: Orders

## 2023-10-02 ENCOUNTER — Ambulatory Visit (INDEPENDENT_AMBULATORY_CARE_PROVIDER_SITE_OTHER): Admitting: Physical Therapy

## 2023-10-02 ENCOUNTER — Encounter: Payer: Self-pay | Admitting: Physical Therapy

## 2023-10-02 DIAGNOSIS — R293 Abnormal posture: Secondary | ICD-10-CM

## 2023-10-02 DIAGNOSIS — M549 Dorsalgia, unspecified: Secondary | ICD-10-CM

## 2023-10-02 DIAGNOSIS — M542 Cervicalgia: Secondary | ICD-10-CM | POA: Diagnosis not present

## 2023-10-02 DIAGNOSIS — R2689 Other abnormalities of gait and mobility: Secondary | ICD-10-CM | POA: Diagnosis not present

## 2023-10-02 DIAGNOSIS — M6281 Muscle weakness (generalized): Secondary | ICD-10-CM | POA: Diagnosis not present

## 2023-10-02 NOTE — Therapy (Signed)
 OUTPATIENT PHYSICAL THERAPY TREATMENT   Patient Name: Sergio Nelson MRN: [id number] DOB:[birth year], 73 y.o., male Today's Date: 10/02/2023  END OF SESSION:  PT End of Session - 10/02/23 1304     Visit Number 7    Number of Visits 12    Date for PT Re-Evaluation 10/11/23    Authorization Type Medicare    Progress Note Due on Visit 10    PT Start Time 1301    PT Stop Time 1340    PT Time Calculation (min) 39 min    Activity Tolerance Patient tolerated treatment well    Behavior During Therapy [facility] for tasks assessed/performed             Past Medical History:  Diagnosis Date   Allergy    Arthritis    hands   Cataract    removed bilat 2021   GERD (gastroesophageal reflux disease)    Past Surgical History:  Procedure Laterality Date   CATARACT EXTRACTION Bilateral 2021   CHOLECYSTECTOMY N/A 10/17/2016   Procedure: LAPAROSCOPIC CHOLECYSTECTOMY WITH  INTRAOPERATIVE CHOLANGIOGRAM;  Surgeon: Caralyn Chandler, MD;  Location: [facility];  Service: General;  Laterality: N/A;   COLONOSCOPY     NERVE TRANSFER     on left elbow (ulnaar neuropathy)   POLYPECTOMY     Patient Active Problem List   Diagnosis Date Noted   Impacted cerumen of both ears 05/23/2023   Sensorineural hearing loss, bilateral 05/23/2023   Tinnitus of both ears 05/23/2023   Seasonal allergies 10/22/2017   Elevated blood pressure reading without diagnosis of hypertension 10/15/2016   Cholecystitis 10/15/2016   RUQ pain 10/12/2016   Chest pain 10/12/2016   Hyperlipidemia 10/12/2016   Routine general medical examination at a health care facility 07/15/2014    PCP: Austine Lefort, MD  REFERRING PROVIDER: Austine Lefort, MD  REFERRING DIAG: M54.9 (ICD-10-CM) - Mid back pain  THERAPY DIAG:  Mid back pain  Cervicalgia  Muscle weakness (generalized)  Other abnormalities of gait and mobility  Abnormal posture  Rationale for Evaluation and Treatment: Rehabilitation  ONSET DATE: November  2024  SUBJECTIVE:                                                                                                                                                                                                         SUBJECTIVE STATEMENT: Pt reports nothing new or different since last session. Feels some improvement.   Hand dominance: Right  PERTINENT HISTORY:  Reports he has had intermittent back pains but has normally been able  to self manage.  Reported no specific pain with exercises from clinic visit.    PAIN:  NPRS scale: at most 2/10 in last few days.  Pain location: neck, back Pain description: Tender to the touch Aggravating factors: Head down/bent positioning, walking for back Relieving factors: Arthritis tylenol , muscle relaxer   PRECAUTIONS: None  RED FLAGS: None     WEIGHT BEARING RESTRICTIONS: No  FALLS:  Has patient fallen in last 6 months? Yes. 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  DIAGNOSTIC FINDINGS:  08/07/23 Thoracic spine x-ray IMPRESSION: No acute fracture or dislocation. Chronic compression of T12 is identified not changed compared prior exam.  03/26/23 cervical spine x-ray IMPRESSION: No acute bony abnormality. Degenerative facet disease with multilevel bilateral neural foraminal narrowing.  03/26/23 lumbar spine x-ray IMPRESSION: Mild degenerative change without acute abnormality.  PATIENT SURVEYS:  The Patient-Specific Functional Scale  Activity Initial (eval): 08/30/2023 09/17/2023  Walking for extended period  of time 7 No number given today (haven't done')  2.   Driving for extended period of time  7 7  3.   Working around the house 5 6  Average 19/3 = 6.33 6.5    POSTURE:  08/30/2023 rounded shoulders and increased thoracic kyphosis  PALPATION: 08/30/2023 No overt tenderness to palpation in C spine or periscapular muscles Mild tightness in thoracolumbar paraspinals  Tender with PA mobs along lower thoracic Hypomobile throughout mid thoracic to upper thoracic   CERVICAL ROM:   Active ROM A/PROM (deg) Eval 08/30/2023 AROM 09/17/2023 AROM 09/25/2023  Flexion 60* (radiates to R shoulder) 60 62  Extension 40 52 53  Right lateral flexion 25*    Left lateral flexion 24*    Right rotation 40* 70 68 ( 72 post manual)  Left rotation 40 60 68 (72  post manual)   (Blank rows = not tested, * = pain)  UPPER EXTREMITY ROM:  08/30/2023 WNL  UPPER EXTREMITY MMT:  MMT Right Eval 08/30/2023 Left Eval 08/30/2023  Shoulder flexion 5 5  Shoulder extension 5 5  Shoulder abduction 4* 5  Shoulder adduction    Shoulder internal rotation 4+ 4+  Shoulder external rotation 4 4  Middle trapezius in prone 4- 4-  Lower trapezius in prone 3- 3   (Blank rows = not tested, * = pain)  CERVICAL SPECIAL TESTS:  08/30/2023 Neck flexor muscle endurance test: Positive, Spurling's test: Positive, and Distraction test: Negative  FUNCTIONAL TESTS:  08/30/2023 Did not assess                 TREATMENT         DATE: 10/02/2023 Therex: UBE fwd/back UE only 4 mins each way with 1 min rest between, lvl 3.0  Seated cervical retraction x10, with green TB x10 Seated cervical retraction + rotation green TB x10  Manual Therapy: Seated thoracic extension over chair 2-3 sec hold 2x 10 with MWM Cervical rotation + side glides grade II to III MWM Cervical lateral and PA mobs grade II to III for lateral flexion and ext MWM STM & TPR lateral cervical paraspinals, UT, levator  scap  Neuro Re-ed: Seated shoulder  diagonals red TB 2x10 Seated shoulder flex + ER red TB 2x10    TREATMENT         DATE: 09/27/2023 Therex: UBE fwd/back UE only 4 mins each way with 1 min rest between, lvl 3.0  Seated cervical rotation towel assisted 5 sec hold x 10 bilateral  Cervical extension towel assisted 2-3 sec hold x 10  Seated upper trap stretch 15 sec x 3 bilateral Seated thoracic extension over chair 2-3 sec hold x 10    Neuro Re-ed(muscle activation, postural awareness/activation) Standing green band bilateral shoulder rows with scapular retraction focus 2 x 15  Standing green band gh ext bilateral 2 x 15  Seated bilateral shoudler ER c scapular retraction green band 2 x 15 Supine cervical retraction isometric hold 5 sec x 10 Supine horizontal abduction green band 2 x 15   TREATMENT         DATE: 09/25/2023 Therex: UBE fwd/back UE only 4 mins each way with 1 min rest between, lvl 3.0  Supine cervical rotation x 10 bilateral.  Seated cervical rotation towel assisted 5 sec hold x 5 bilateral  Seated upper trap stretch 15 sec x 3 bilateral Seated thoracic extension over chair 2-3 sec hold x 10  Time spent in technique education and performance.   Neuro Re-ed(muscle activation, postural awareness/activation) Supine cervical retraction into pillow isometric 5 sec hold x 10  Supine scapular retraction into table isometric 5 sec hold x 10  Supine bilateral gh ext isometric into table 5 sec hold x 10  Seated bilateral shoudler ER c scapular retraction green band 2 x 10  Time spent in technique education and performance.   Manual Supine cervical downslope mobs g3 C3-C7 bilaterally for joint mobility improvements.    TREATMENT         DATE: 09/21/2023 Therex: UBE fwd/back UE only 3 mins each way with 1 min rest between, lvl 2.5  Standing doorway pec stretch low, mid, high x30 each Upper trap Rt self stretch 2x30 Levator scap stretch 2x30 Seated thoracic ext x10  Neuro Re-ed Seated Cervical  retraction 2x10 Cervical retraction + ext x10 Cervical retraction + rotation x10 W 2x10 Horizontal shoulder abd 90/90 2x10 Attempted serratus anterior + shoulder flexion   TREATMENT         DATE: 09/17/2023 Therex: UBE fwd/back UE only 3 mins each way with 1 min rest between, lvl 2.5  Upper trap Rt self stretch 15 sec x 3   Review of HEP additions /adjustments with handout provided.   Neuro Re-ed(muscle activation, postural awareness/activation) Green band bilateral tband rows c scapular retraction focus 2 x 15  Green band bilateral GH ext 2 x 15 Review of scapular retraction c ER band exercise Seated cervical isometric retraction press into ball at wall 5 sec hold x 10 (added to home)  Manual Percussive device to Rt upper trap trigger point  PATIENT EDUCATION:  Eval; Education details: Exam findings, POC, initial HEP, MRI vs not Person educated: Patient Education method: Explanation, Demonstration, and Handouts Education comprehension: verbalized understanding, returned demonstration, and needs further education  HOME EXERCISE PROGRAM: Access Code: [email or web address] URL: [email or web address] Date: 09/17/2023 Prepared by: Bonna Bustard  Exercises - Seated Upper Trapezius Stretch  - 2 x daily - 7 x weekly - 1 sets - 5 reps - 15 hold - Supine Cervical Retraction with Towel  - 1 x daily - 7 x weekly - 1 sets - 10 reps - 3  sec hold - Cervical Retraction at Wall  - 2 x daily - 7 x weekly - 1 sets - 5-10 reps - 5 hold - Supine Deep Neck Flexor Training  - 1 x daily - 7 x weekly - 2 sets - 10 reps - 3 sec hold - Supine Lower Trunk Rotation  - 1 x daily - 7 x weekly - 1 sets - 5 reps - 10 sec hold - Supine Transversus Abdominis Bracing - Hands on Stomach  - 1 x daily - 7 x weekly - 2 sets - 10 reps - Shoulder W - External Rotation with Resistance  - 1 x daily - 7 x weekly - 2 sets - 10 reps - Standing Bilateral Low Shoulder Row with Anchored Resistance  - 1-2 x daily - 7 x  weekly - 2-3 sets - 10-15 reps - Shoulder Extension with Resistance  - 1-2 x daily - 7 x weekly - 1-2 sets - 10-15 reps  ASSESSMENT:  CLINICAL IMPRESSION: Continuing to improve cervical motion, deep neck strengthening and postural strengthening with good pt tolerance. Some continued spasm along lateral neck -- discussed TPDN; however, pt defers for now. Good response with TPR.   OBJECTIVE IMPAIRMENTS: Abnormal gait, decreased activity tolerance, decreased coordination, decreased endurance, decreased mobility, difficulty walking, decreased ROM, decreased strength, hypomobility, increased fascial restrictions, increased muscle spasms, improper body mechanics, postural dysfunction, and pain.    GOALS: Goals reviewed with patient? Yes  SHORT TERM GOALS: Target date: 09/20/2023   Pt will be ind with initial HEP Baseline:  Goal status: mostly met  2.  Pt will be able to perform TSA contraction without glute/LE compensation to demo improving core strength/activation Baseline:  Goal status: met    LONG TERM GOALS: Target date: 10/11/2023   Pt will be ind with management and progression of HEP Baseline:  Goal status: on going 09/25/2023  2.  Pt will have improved PSFS average score to >/=8.33 to demo MCID Baseline:  Goal status: on going 09/25/2023  3.  Pt will be able to perform all cervical ROM without pain Baseline:  Goal status: on going 09/25/2023  4.  Pt will report >/=75% improvement in overall pain with activity Baseline:  Goal status: on going 09/25/2023  5.  Pt will be able to lift and carry at least 25# without increased lumbar or cervical strain Baseline:  Goal status: on going 09/25/2023  PLAN:  PT FREQUENCY: 1-2x/week  PT DURATION: 6 weeks  PLANNED INTERVENTIONS: 97164- PT Re-evaluation, 97750- Physical Performance Testing, 97110-Therapeutic exercises, 97530- Therapeutic activity, [id number]- Neuromuscular re-education, 97535- Self Care, 16109- Manual therapy, [id number]-  Gait training, [phone]- Aquatic Therapy, [phone]- Electrical stimulation (unattended), 97035- Ultrasound, 91478- Traction (mechanical), [id number]- Ionotophoresis 4mg /ml Dexamethasone , Patient/Family education, Balance training, Stair training, Taping, Dry Needling, Joint mobilization, Spinal mobilization, Cryotherapy, and Moist heat  PLAN FOR NEXT SESSION: Show theracane? Continued to improve cervical mobility.  Recert date upcoming on 10/11/2023.    Shresta Risden April Ma L Shikara Mcauliffe, PT, DPT 10/02/23  1:04 PM

## 2023-10-04 ENCOUNTER — Ambulatory Visit (INDEPENDENT_AMBULATORY_CARE_PROVIDER_SITE_OTHER): Admitting: Physical Therapy

## 2023-10-04 DIAGNOSIS — M6281 Muscle weakness (generalized): Secondary | ICD-10-CM

## 2023-10-04 DIAGNOSIS — M549 Dorsalgia, unspecified: Secondary | ICD-10-CM | POA: Diagnosis not present

## 2023-10-04 DIAGNOSIS — M542 Cervicalgia: Secondary | ICD-10-CM | POA: Diagnosis not present

## 2023-10-04 DIAGNOSIS — R2689 Other abnormalities of gait and mobility: Secondary | ICD-10-CM

## 2023-10-04 DIAGNOSIS — R293 Abnormal posture: Secondary | ICD-10-CM | POA: Diagnosis not present

## 2023-10-04 NOTE — Therapy (Signed)
 OUTPATIENT PHYSICAL THERAPY TREATMENT   Patient Name: Sergio Nelson MRN: [id number] DOB:[birth year], 73 y.o., male Today's Date: 10/04/2023  END OF SESSION:  PT End of Session - 10/04/23 1255     Visit Number 8    Number of Visits 12    Date for PT Re-Evaluation 10/11/23    Authorization Type Medicare    Progress Note Due on Visit 10    PT Start Time 1300    PT Stop Time 1340    PT Time Calculation (min) 40 min    Activity Tolerance Patient tolerated treatment well    Behavior During Therapy [facility] for tasks assessed/performed          Past Medical History:  Diagnosis Date   Allergy    Arthritis    hands   Cataract    removed bilat 2021   GERD (gastroesophageal reflux disease)    Past Surgical History:  Procedure Laterality Date   CATARACT EXTRACTION Bilateral 2021   CHOLECYSTECTOMY N/A 10/17/2016   Procedure: LAPAROSCOPIC CHOLECYSTECTOMY WITH  INTRAOPERATIVE CHOLANGIOGRAM;  Surgeon: Caralyn Chandler, MD;  Location: [facility];  Service: General;  Laterality: N/A;   COLONOSCOPY     NERVE TRANSFER     on left elbow (ulnaar neuropathy)   POLYPECTOMY     Patient Active Problem List   Diagnosis Date Noted   Impacted cerumen of both ears 05/23/2023   Sensorineural hearing loss, bilateral 05/23/2023   Tinnitus of both ears 05/23/2023   Seasonal allergies 10/22/2017   Elevated blood pressure reading without diagnosis of hypertension 10/15/2016   Cholecystitis 10/15/2016   RUQ pain 10/12/2016   Chest pain 10/12/2016   Hyperlipidemia 10/12/2016   Routine general medical examination at a health care facility 07/15/2014    PCP: Austine Lefort, MD  REFERRING PROVIDER: Austine Lefort, MD  REFERRING DIAG: M54.9 (ICD-10-CM) - Mid back pain  THERAPY DIAG:  Mid back pain  Cervicalgia  Muscle weakness (generalized)  Other abnormalities of gait and mobility  Abnormal posture  Rationale for Evaluation and Treatment: Rehabilitation  ONSET DATE: November  2024  SUBJECTIVE:                                                                                                                                                                                                         SUBJECTIVE STATEMENT: Pt reports nothing new or different since last visit.   Hand dominance: Right  PERTINENT HISTORY:  Reports he has had intermittent back pains but has normally been able to self manage.  Reported no  specific pain with exercises from clinic visit.    PAIN:  NPRS scale: at most 2/10 in last few days.  Pain location: neck, back Pain description: Tender to the touch Aggravating factors: Head down/bent positioning, walking for back Relieving factors: Arthritis tylenol , muscle relaxer   PRECAUTIONS: None   WEIGHT BEARING RESTRICTIONS: No  FALLS:  Has patient fallen in last 6 months? Yes. 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  DIAGNOSTIC FINDINGS:  08/07/23 Thoracic spine x-ray IMPRESSION: No acute fracture or dislocation. Chronic compression of T12 is identified not changed compared prior exam.  03/26/23 cervical spine x-ray IMPRESSION: No acute bony abnormality. Degenerative facet disease with multilevel bilateral neural foraminal narrowing.  03/26/23 lumbar spine x-ray IMPRESSION: Mild degenerative change without acute abnormality.  PATIENT SURVEYS:  The Patient-Specific Functional Scale  Activity Initial (eval): 08/30/2023 09/17/2023  Walking for extended period of time 7 No number given today (haven't done')   2.   Driving for extended period of time  7 7  3.   Working around the house 5 6  Average 19/3 = 6.33 6.5    POSTURE:  08/30/2023 rounded shoulders and increased thoracic kyphosis  PALPATION: 08/30/2023 No overt tenderness to palpation in C spine or periscapular muscles Mild tightness in thoracolumbar paraspinals  Tender with PA mobs along lower thoracic Hypomobile throughout mid thoracic to upper thoracic   CERVICAL ROM:   Active ROM A/PROM (deg) Eval 08/30/2023 AROM 09/17/2023 AROM 09/25/2023  Flexion 60* (radiates to R shoulder) 60 62  Extension 40 52 53  Right lateral flexion 25*    Left lateral flexion 24*    Right rotation 40* 70 68 ( 72 post manual)  Left rotation 40 60 68 (72  post manual)   (Blank rows = not tested, * = pain)  UPPER EXTREMITY ROM:  08/30/2023 WNL  UPPER EXTREMITY MMT:  MMT Right Eval 08/30/2023 Left Eval 08/30/2023  Shoulder flexion 5 5  Shoulder extension 5 5  Shoulder abduction 4* 5  Shoulder adduction    Shoulder internal rotation 4+ 4+  Shoulder external rotation 4 4  Middle trapezius in prone 4- 4-  Lower trapezius in prone 3- 3   (Blank rows = not tested, * = pain)  CERVICAL SPECIAL TESTS:  08/30/2023 Neck flexor muscle endurance test: Positive, Spurling's test: Positive, and Distraction test: Negative  FUNCTIONAL TESTS:  08/30/2023 Did not assess                 TREATMENT         DATE: 10/04/2023 Therex: UBE fwd/back UE only 4 mins each way with 1 min rest between, lvl 3.0  Seated thoracic extension over chair 2-3 sec hold 2x 10  Self Care: Theracane use  Manual Therapy: Seated thoracic rotation grade II to III MWM Seated thoracic ext grade II to III MWM  Neuro Re-ed: Wall plank + cervical retraction 2x10 Standing against wall diagonals + cervical retraction 2x10 Standing against wall shoulder flex + ER red TB 2x10  Therapeutic Activity: Bottoms up KB carry 10# 2x15' Overhead KB carry 10# 2x15' Overhead  tricep  extension 10# KB 2x10    TREATMENT         DATE: 10/02/2023 Therex: UBE fwd/back UE only 4 mins each way with 1 min rest between, lvl 3.0  Seated cervical retraction x10, with green TB x10 Seated cervical retraction + rotation green TB x10  Manual Therapy: Seated thoracic extension over chair 2-3 sec hold 2x 10 with MWM Cervical rotation + side glides grade II to III MWM Cervical lateral and PA mobs grade II to III for lateral flexion and ext MWM STM & TPR lateral cervical paraspinals, UT, levator scap  Neuro Re-ed: Seated shoulder diagonals red TB 2x10 Seated shoulder flex + ER red TB 2x10    TREATMENT         DATE: 09/27/2023 Therex: UBE fwd/back UE only 4 mins each way with 1 min rest between, lvl 3.0  Seated cervical rotation towel assisted 5 sec hold x 10 bilateral  Cervical extension towel assisted 2-3 sec hold x 10  Seated upper trap stretch 15 sec x 3 bilateral Seated thoracic extension over chair 2-3 sec hold x 10    Neuro Re-ed(muscle activation, postural awareness/activation) Standing green band bilateral shoulder rows with scapular retraction focus 2 x 15  Standing green band gh ext bilateral 2 x 15  Seated bilateral shoudler ER c scapular retraction green band 2 x 15 Supine cervical retraction isometric hold 5 sec x 10 Supine horizontal abduction green band 2 x 15   TREATMENT         DATE: 09/25/2023 Therex: UBE fwd/back UE only 4 mins each way with 1 min rest between, lvl 3.0  Supine cervical rotation x 10 bilateral.  Seated cervical rotation towel assisted 5 sec hold x 5 bilateral  Seated upper trap stretch 15 sec x 3 bilateral Seated thoracic extension over chair 2-3 sec hold x 10  Time spent in technique education and performance.   Neuro Re-ed(muscle activation, postural awareness/activation) Supine cervical retraction into pillow isometric 5 sec hold x 10  Supine scapular retraction into table isometric 5 sec hold x 10  Supine bilateral gh ext  isometric into table 5 sec hold x 10  Seated bilateral shoudler ER c scapular retraction green band 2 x 10  Time spent in technique education and performance.   Manual Supine cervical downslope mobs g3 C3-C7 bilaterally for joint mobility improvements.    TREATMENT         DATE: 09/21/2023 Therex: UBE fwd/back UE only 3 mins each way with 1 min rest between, lvl 2.5  Standing doorway pec stretch low, mid, high x30 each Upper trap Rt self stretch 2x30 Levator scap stretch 2x30 Seated thoracic ext x10  Neuro Re-ed Seated Cervical retraction 2x10 Cervical retraction + ext x10 Cervical retraction + rotation x10 W 2x10 Horizontal shoulder abd 90/90 2x10 Attempted serratus anterior + shoulder flexion   TREATMENT         DATE: 09/17/2023 Therex: UBE fwd/back UE only 3 mins each way with 1 min rest between, lvl 2.5  Upper trap Rt self stretch 15 sec x 3   Review of HEP additions /adjustments with handout provided.   Neuro Re-ed(muscle activation, postural awareness/activation) Green band bilateral tband rows c scapular retraction focus 2 x 15  Green band bilateral GH ext 2 x 15 Review of scapular retraction c ER band exercise Seated cervical isometric retraction press into ball at wall 5 sec hold x 10 (added to home)  Manual Percussive device to Rt upper  trap trigger point  PATIENT EDUCATION:  Eval; Education details: Exam findings, POC, initial HEP, MRI vs not Person educated: Patient Education method: Explanation, Demonstration, and Handouts Education comprehension: verbalized understanding, returned demonstration, and needs further education  HOME EXERCISE PROGRAM: Access Code: [email or web address] URL: [email or web address] Date: 09/17/2023 Prepared by: Bonna Bustard  Exercises - Seated Upper Trapezius Stretch  - 2 x daily - 7 x weekly - 1 sets - 5 reps - 15 hold - Supine Cervical Retraction with Towel  - 1 x daily - 7 x weekly - 1 sets - 10 reps - 3 sec  hold - Cervical Retraction at Wall  - 2 x daily - 7 x weekly - 1 sets - 5-10 reps - 5 hold - Supine Deep Neck Flexor Training  - 1 x daily - 7 x weekly - 2 sets - 10 reps - 3 sec hold - Supine Lower Trunk Rotation  - 1 x daily - 7 x weekly - 1 sets - 5 reps - 10 sec hold - Supine Transversus Abdominis Bracing - Hands on Stomach  - 1 x daily - 7 x weekly - 2 sets - 10 reps - Shoulder W - External Rotation with Resistance  - 1 x daily - 7 x weekly - 2 sets - 10 reps - Standing Bilateral Low Shoulder Row with Anchored Resistance  - 1-2 x daily - 7 x weekly - 2-3 sets - 10-15 reps - Shoulder Extension with Resistance  - 1-2 x daily - 7 x weekly - 1-2 sets - 10-15 reps  ASSESSMENT:  CLINICAL IMPRESSION: Less spasm in lateral cervical paraspinals this session. Demonstrated to pt how to use theracane to help self manage re-exacerbations at home. Able to work on overhead lifting and carrying without neck pain or pulling.   OBJECTIVE IMPAIRMENTS: Abnormal gait, decreased activity tolerance, decreased coordination, decreased endurance, decreased mobility, difficulty walking, decreased ROM, decreased strength, hypomobility, increased fascial restrictions, increased muscle spasms, improper body mechanics, postural dysfunction, and pain.    GOALS: Goals reviewed with patient? Yes  SHORT TERM GOALS: Target date: 09/20/2023   Pt will be ind with initial HEP Baseline:  Goal status: mostly met  2.  Pt will be able to perform TSA contraction without glute/LE compensation to demo improving core strength/activation Baseline:  Goal status: met    LONG TERM GOALS: Target date: 10/11/2023   Pt will be ind with management and progression of HEP Baseline:  Goal status: on going 09/25/2023  2.  Pt will have improved PSFS average score to >/=8.33 to demo MCID Baseline:  Goal status: on going 09/25/2023  3.  Pt will be able to perform all cervical ROM without pain Baseline:  Goal status: on going  09/25/2023  4.  Pt will report >/=75% improvement in overall pain with activity Baseline:  Goal status: on going 09/25/2023  5.  Pt will be able to lift and carry at least 25# without increased lumbar or cervical strain Baseline:  Goal status: on going 09/25/2023  PLAN:  PT FREQUENCY: 1-2x/week  PT DURATION: 6 weeks  PLANNED INTERVENTIONS: [id number]- PT Re-evaluation, 97750- Physical Performance Testing, 97110-Therapeutic exercises, 97530- Therapeutic activity, [id number]- Neuromuscular re-education, 97535- Self Care, 82956- Manual therapy, [id number]- Gait training, [phone]- Aquatic Therapy, [phone]- Electrical stimulation (unattended), [id number]- Ultrasound, 69629- Traction (mechanical), [id number]- Ionotophoresis 4mg /ml Dexamethasone , Patient/Family education, Balance training, Stair training, Taping, Dry Needling, Joint mobilization, Spinal mobilization, Cryotherapy, and Moist heat  PLAN FOR NEXT SESSION: D/C? Continued to improve cervical mobility.  Recert date  upcoming on 10/11/2023.    Bernerd Terhune April Ma L Sada Mazzoni, PT, DPT 10/04/23  12:55 PM

## 2023-10-05 DIAGNOSIS — H35372 Puckering of macula, left eye: Secondary | ICD-10-CM | POA: Diagnosis not present

## 2023-10-05 DIAGNOSIS — Z961 Presence of intraocular lens: Secondary | ICD-10-CM | POA: Diagnosis not present

## 2023-10-11 ENCOUNTER — Encounter: Payer: Self-pay | Admitting: Physical Therapy

## 2023-10-11 ENCOUNTER — Ambulatory Visit (INDEPENDENT_AMBULATORY_CARE_PROVIDER_SITE_OTHER): Admitting: Physical Therapy

## 2023-10-11 DIAGNOSIS — M542 Cervicalgia: Secondary | ICD-10-CM

## 2023-10-11 DIAGNOSIS — R293 Abnormal posture: Secondary | ICD-10-CM | POA: Diagnosis not present

## 2023-10-11 DIAGNOSIS — R2689 Other abnormalities of gait and mobility: Secondary | ICD-10-CM | POA: Diagnosis not present

## 2023-10-11 DIAGNOSIS — M549 Dorsalgia, unspecified: Secondary | ICD-10-CM

## 2023-10-11 DIAGNOSIS — M6281 Muscle weakness (generalized): Secondary | ICD-10-CM

## 2023-10-11 NOTE — Therapy (Signed)
 OUTPATIENT PHYSICAL THERAPY TREATMENT AND PROGRESS NOTE  Progress Note Reporting Period 08/30/23 to 10/11/2023   See note below for Objective Data and Assessment of Progress/Goals.       Patient Name: Sergio Nelson MRN: [id number] DOB:1950/09/14, 73 y.o., male Today's Date: 10/11/2023  END OF SESSION:  PT End of Session - 10/11/23 1258     Visit Number 9    Number of Visits 12    Date for PT Re-Evaluation 10/11/23    Authorization Type Medicare    Progress Note Due on Visit 10    PT Start Time 1300    PT Stop Time 1340    PT Time Calculation (min) 40 min    Activity Tolerance Patient tolerated treatment well    Behavior During Therapy [facility] for tasks assessed/performed           Past Medical History:  Diagnosis Date   Allergy    Arthritis    hands   Cataract    removed bilat 2021   GERD (gastroesophageal reflux disease)    Past Surgical History:  Procedure Laterality Date   CATARACT EXTRACTION Bilateral 2021   CHOLECYSTECTOMY N/A 10/17/2016   Procedure: LAPAROSCOPIC CHOLECYSTECTOMY WITH  INTRAOPERATIVE CHOLANGIOGRAM;  Surgeon: Curvin Deward MOULD, MD;  Location: [facility];  Service: General;  Laterality: N/A;   COLONOSCOPY     NERVE TRANSFER     on left elbow (ulnaar neuropathy)   POLYPECTOMY     Patient Active Problem List   Diagnosis Date Noted   Impacted cerumen of both ears 05/23/2023   Sensorineural hearing loss, bilateral 05/23/2023   Tinnitus of both ears 05/23/2023   Seasonal allergies 10/22/2017   Elevated blood pressure reading without diagnosis of hypertension 10/15/2016   Cholecystitis 10/15/2016   RUQ pain 10/12/2016   Chest pain 10/12/2016   Hyperlipidemia 10/12/2016   Routine general medical examination at a health care facility 07/15/2014    PCP: Duanne Butler DASEN, MD  REFERRING PROVIDER: Duanne Butler DASEN, MD  REFERRING DIAG: M54.9 (ICD-10-CM) - Mid back pain  THERAPY DIAG:  Mid back pain  Cervicalgia  Muscle weakness  (generalized)  Other abnormalities of gait and mobility  Abnormal posture  Rationale for Evaluation and Treatment: Rehabilitation  ONSET DATE: November 2024  SUBJECTIVE:                                                                                                                                                                                                         SUBJECTIVE STATEMENT: Pt states he's been feeling a little sore  today. Shoulder feels tight today. I probably did more than I should have. I cut down 2 trees and worked on the truck  Hand dominance: Right  PERTINENT HISTORY:  Reports he has had intermittent back pains but has normally been able to self manage.  Reported no specific pain with exercises from clinic visit.    PAIN:  NPRS scale: at most 2/10 in last few days.  Pain location: neck, back Pain description: Tender to the touch Aggravating factors: Head down/bent positioning, walking for back Relieving factors: Arthritis tylenol , muscle relaxer   PRECAUTIONS: None   WEIGHT BEARING RESTRICTIONS: No  FALLS:  Has patient fallen in last 6 months? Yes. 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  DIAGNOSTIC FINDINGS:  08/07/23 Thoracic spine x-ray IMPRESSION: No acute fracture or dislocation. Chronic compression of T12 is identified not changed compared prior exam.  03/26/23 cervical spine x-ray IMPRESSION: No acute bony abnormality. Degenerative facet disease with multilevel bilateral neural foraminal narrowing.  03/26/23  lumbar spine x-ray IMPRESSION: Mild degenerative change without acute abnormality.  PATIENT SURVEYS:  The Patient-Specific Functional Scale  Activity Initial (eval): 08/30/2023 09/17/2023 10/11/2023  Walking for extended period of time 7 No number given today (haven't done') 8 or 9  2.   Driving for extended period of time  7 7 7  (I haven't done any)  3.   Working around the house 5 6 6   Average 19/3 = 6.33 6.5 7.2    POSTURE:  08/30/2023 rounded shoulders and increased thoracic kyphosis  PALPATION: 08/30/2023 No overt tenderness to palpation in C spine or periscapular muscles Mild tightness in thoracolumbar paraspinals  Tender with PA mobs along lower thoracic Hypomobile throughout mid thoracic to upper thoracic   CERVICAL ROM:   Active ROM A/PROM (deg) Eval 08/30/2023 AROM 09/17/2023 AROM 09/25/2023 AROM 10/11/2023  Flexion 60* (radiates to R shoulder) 60 62 60  Extension 40 52 53 55  Right lateral flexion 25*   35  Left lateral flexion 24*   25 pulls  Right rotation 40* 70 68 ( 72 post manual) 70  Left rotation 40 60 68 (72  post manual) 65   (Blank rows = not tested, * = pain)  UPPER EXTREMITY ROM:  08/30/2023 WNL  UPPER EXTREMITY MMT:  MMT Right Eval 08/30/2023 Left Eval 08/30/2023  Shoulder flexion 5 5  Shoulder extension 5 5  Shoulder abduction 4* 5  Shoulder adduction    Shoulder internal rotation 4+ 4+  Shoulder external rotation 4 4  Middle trapezius in prone 4- 4-  Lower trapezius in prone 3- 3   (Blank rows = not tested, * = pain)  CERVICAL SPECIAL TESTS:  08/30/2023 Neck flexor muscle endurance test: Positive, Spurling's test: Positive, and Distraction test: Negative  FUNCTIONAL TESTS:  08/30/2023 Did not assess                 TREATMENT         DATE: 10/04/2023 Therex: Seated thoracic extension over chair 2-3 sec hold 2x 10  Manual Therapy: Seated thoracic rotation grade II to III MWM Seated thoracic  ext grade II to III MWM STM & TPR  cervical paraspinals, levator scap, UTs  Therapeutic Activity: Row blue TB with resistance at lowest position 2x10 Bicep curl blue TB with resistance at lowest position 2x10 Bent over row 15# KB 2x10   TREATMENT         DATE: 10/04/2023 Therex: UBE fwd/back UE only 4 mins each way with 1 min rest between, lvl 3.0  Seated thoracic extension over chair 2-3 sec hold 2x 10  Self Care: Theracane use  Manual Therapy: Seated thoracic rotation grade II to III MWM Seated thoracic ext grade II to III MWM  Neuro Re-ed: Wall plank + cervical retraction 2x10 Standing against wall diagonals + cervical retraction 2x10 Standing against wall shoulder flex + ER red TB 2x10  Therapeutic Activity: Bottoms up KB carry 10# 2x15' Overhead KB carry 10# 2x15' Overhead tricep extension 10# KB 2x10    TREATMENT         DATE: 10/02/2023 Therex: UBE fwd/back UE only 4 mins each way with 1 min rest between, lvl 3.0  Seated cervical retraction x10, with green TB x10 Seated cervical retraction + rotation green TB x10  Manual Therapy: Seated thoracic extension over chair 2-3 sec hold 2x 10 with MWM Cervical rotation + side glides grade II to III MWM Cervical lateral and PA mobs grade II to III for lateral flexion and ext MWM STM & TPR lateral cervical paraspinals, UT, levator scap  Neuro Re-ed: Seated shoulder diagonals red TB 2x10 Seated shoulder flex + ER red TB 2x10    TREATMENT         DATE: 09/27/2023 Therex: UBE fwd/back UE only 4 mins each way with 1 min rest between, lvl 3.0  Seated cervical rotation towel assisted 5 sec hold x 10 bilateral  Cervical extension towel assisted 2-3 sec hold x 10  Seated upper trap stretch 15 sec x 3 bilateral Seated thoracic extension over chair 2-3 sec hold x 10    Neuro Re-ed(muscle activation, postural awareness/activation) Standing green band bilateral shoulder rows with scapular retraction focus 2 x 15  Standing green band gh ext bilateral 2 x 15   Seated bilateral shoudler ER c scapular retraction green band 2 x 15 Supine cervical retraction isometric hold 5 sec x 10 Supine horizontal abduction green band 2 x 15   TREATMENT         DATE: 09/25/2023 Therex: UBE fwd/back UE only 4 mins each way with 1 min rest between, lvl 3.0  Supine cervical rotation x 10 bilateral.  Seated cervical rotation towel assisted 5 sec hold x 5 bilateral  Seated upper trap stretch 15 sec x 3 bilateral Seated thoracic extension over chair 2-3 sec hold x 10  Time spent in technique education and performance.   Neuro Re-ed(muscle activation, postural awareness/activation) Supine cervical retraction into pillow isometric 5 sec hold x 10  Supine scapular retraction into table isometric 5 sec hold x 10  Supine bilateral gh ext isometric into table 5 sec hold x 10  Seated bilateral shoudler ER c scapular retraction green band 2 x 10  Time spent in technique education and performance.   Manual Supine cervical downslope mobs g3 C3-C7 bilaterally for joint mobility improvements.    TREATMENT         DATE: 09/21/2023 Therex: UBE fwd/back UE only 3 mins each way with 1 min rest between, lvl 2.5  Standing doorway pec stretch low, mid, high x30 each Upper trap Rt self stretch  2x30 Levator scap stretch 2x30 Seated thoracic ext x10  Neuro Re-ed Seated Cervical retraction 2x10 Cervical retraction + ext x10 Cervical retraction + rotation x10 W 2x10 Horizontal shoulder abd 90/90 2x10 Attempted serratus anterior + shoulder flexion   TREATMENT         DATE: 09/17/2023 Therex: UBE fwd/back UE only 3 mins each way with 1 min rest between, lvl 2.5  Upper trap Rt self stretch 15 sec x 3   Review of HEP additions /adjustments with handout provided.   Neuro Re-ed(muscle activation, postural awareness/activation) Green band bilateral tband rows c scapular retraction focus 2 x 15  Green band bilateral GH ext 2 x 15 Review of scapular retraction c ER band  exercise Seated cervical isometric retraction press into ball at wall 5 sec hold x 10 (added to home)  Manual Percussive device to Rt upper trap trigger point  PATIENT EDUCATION:  Eval; Education details: Exam findings, POC, initial HEP, MRI vs not Person educated: Patient Education method: Explanation, Demonstration, and Handouts Education comprehension: verbalized understanding, returned demonstration, and needs further education  HOME EXERCISE PROGRAM: Access Code: [email or web address] URL: [email or web address] Date: 09/17/2023 Prepared by: Ozell Silvan  Exercises - Seated Upper Trapezius Stretch  - 2 x daily - 7 x weekly - 1 sets - 5 reps - 15 hold - Supine Cervical Retraction with Towel  - 1 x daily - 7 x weekly - 1 sets - 10 reps - 3 sec hold - Cervical Retraction at Wall  - 2 x daily - 7 x weekly - 1 sets - 5-10 reps - 5 hold - Supine Deep Neck Flexor Training  - 1 x daily - 7 x weekly - 2 sets - 10 reps - 3 sec hold - Supine Lower Trunk Rotation  - 1 x daily - 7 x weekly - 1 sets - 5 reps - 10 sec hold - Supine Transversus Abdominis Bracing - Hands on Stomach  - 1 x daily - 7 x weekly - 2 sets - 10 reps - Shoulder W - External Rotation with Resistance  - 1 x daily - 7 x weekly - 2 sets - 10 reps - Standing Bilateral Low Shoulder Row with Anchored Resistance  - 1-2 x daily - 7 x weekly - 2-3 sets - 10-15 reps - Shoulder Extension with Resistance  - 1-2 x daily - 7 x weekly - 1-2 sets - 10-15 reps  ASSESSMENT:  CLINICAL IMPRESSION: Some increased neck muscle/upper shoulder spasm from increased work load this past weekend. Improved pain after manual work by end of session. Reinforced body mechanics this session with pushing, pulling and lifting using his midback/scapular muscles without cervical compensation. Continued strengthening for postural stability. Rechecked pt's goals -- pt's goals remain ongoing at this time but is slowly making progress. He is able to have just  some pulling with cervical ROM but no pain. Pt will benefit from a few more weeks of PT so pt will be ind with maintaining good body mechanics for his home tasks. Pt is interested in pursuing MRI.   OBJECTIVE IMPAIRMENTS: Abnormal gait, decreased activity tolerance, decreased coordination, decreased endurance, decreased mobility, difficulty walking, decreased ROM, decreased strength, hypomobility, increased fascial restrictions, increased muscle spasms, improper body mechanics, postural dysfunction, and pain.    GOALS: Goals reviewed with patient? Yes  SHORT TERM GOALS: Target date: 09/20/2023   Pt will be ind with initial HEP Baseline:  Goal status: mostly met  2.  Pt will be able to perform TSA  contraction without glute/LE compensation to demo improving core strength/activation Baseline:  Goal status: met    LONG TERM GOALS: Target date: 11/08/2023   Pt will be ind with management and progression of HEP Baseline:  Goal status: on going 10/11/2023  2.  Pt will have improved PSFS average score to >/=8.33 to demo MCID Baseline:  Goal status: on going 10/11/2023 - 7.12  3.  Pt will be able to perform all cervical ROM without pain Baseline:  Goal status: Met 10/11/2023 - some pulling with lateral flexion but no pain  4.  Pt will report >/=75% improvement in overall pain with activity Baseline:  Goal status: on going 10/11/2023 -- at least 30-40% improvement  5.  Pt will be able to lift and carry at least 25# without increased lumbar or cervical strain Baseline:  10/11/23: Was able to haul off and push 75# but increased pain. Able to perform 15# with good form in clinic with PT supervision and cueing Goal status: on going 10/11/2023  PLAN:  PT FREQUENCY: 1-2x/week  PT DURATION: 6 weeks  PLANNED INTERVENTIONS: [id number]- PT Re-evaluation, 97750- Physical Performance Testing, 97110-Therapeutic exercises, 97530- Therapeutic activity, [id number]- Neuromuscular re-education, 97535- Self  Care, [id number]- Manual therapy, [id number]- Gait training, [phone]- Aquatic Therapy, [phone]- Electrical stimulation (unattended), [id number]- Ultrasound, 02987- Traction (mechanical), [id number]- Ionotophoresis 4mg /ml Dexamethasone , Patient/Family education, Balance training, Stair training, Taping, Dry Needling, Joint mobilization, Spinal mobilization, Cryotherapy, and Moist heat  PLAN FOR NEXT SESSION: Continued to improve cervical mobility.  Work on Estate manager/land agent with heavy lifting/pushing/pulling   Udell Mazzocco April Honor CROME Donevan Biller, [place], DPT 10/11/23  12:58 PM

## 2023-10-30 ENCOUNTER — Ambulatory Visit

## 2023-10-30 DIAGNOSIS — M6281 Muscle weakness (generalized): Secondary | ICD-10-CM

## 2023-10-30 DIAGNOSIS — M549 Dorsalgia, unspecified: Secondary | ICD-10-CM | POA: Diagnosis not present

## 2023-10-30 DIAGNOSIS — R293 Abnormal posture: Secondary | ICD-10-CM | POA: Diagnosis not present

## 2023-10-30 DIAGNOSIS — M542 Cervicalgia: Secondary | ICD-10-CM

## 2023-10-30 NOTE — Therapy (Signed)
 OUTPATIENT PHYSICAL THERAPY TREATMENT       Patient Name: Sergio Nelson MRN: [id number] DOB:Jul 15, 1950, 73 y.o., male Today's Date: 10/30/2023  END OF SESSION:  PT End of Session - 10/30/23 1526     Visit Number 10    Number of Visits 15    Date for PT Re-Evaluation 11/08/23    Authorization Type Medicare    Progress Note Due on Visit 19   Updated to reflect Progress done on #9   PT Start Time 1432    PT Stop Time 1515    PT Time Calculation (min) 43 min    Activity Tolerance Patient tolerated treatment well    Behavior During Therapy [facility] for tasks assessed/performed            Past Medical History:  Diagnosis Date   Allergy    Arthritis    hands   Cataract    removed bilat 2021   GERD (gastroesophageal reflux disease)    Past Surgical History:  Procedure Laterality Date   CATARACT EXTRACTION Bilateral 2021   CHOLECYSTECTOMY N/A 10/17/2016   Procedure: LAPAROSCOPIC CHOLECYSTECTOMY WITH  INTRAOPERATIVE CHOLANGIOGRAM;  Surgeon: Curvin Deward MOULD, MD;  Location: [facility];  Service: General;  Laterality: N/A;   COLONOSCOPY     NERVE TRANSFER     on left elbow (ulnaar neuropathy)   POLYPECTOMY     Patient Active Problem List   Diagnosis Date Noted   Impacted cerumen of both ears 05/23/2023   Sensorineural hearing loss, bilateral 05/23/2023   Tinnitus of both ears 05/23/2023   Seasonal allergies 10/22/2017   Elevated blood pressure reading without diagnosis of hypertension 10/15/2016   Cholecystitis 10/15/2016   RUQ pain 10/12/2016   Chest pain 10/12/2016   Hyperlipidemia 10/12/2016   Routine general medical examination at a health care facility 07/15/2014    PCP: Duanne Butler DASEN, MD  REFERRING PROVIDER: Duanne Butler DASEN, MD  REFERRING DIAG: M54.9 (ICD-10-CM) - Mid back pain  THERAPY DIAG:  Mid back pain  Cervicalgia  Abnormal posture  Muscle weakness (generalized)  Rationale for Evaluation and Treatment: Rehabilitation  ONSET DATE: November  2024  SUBJECTIVE:                                                                                                                                                                                                         SUBJECTIVE STATEMENT: Pt states he was on vacation and had no increase in cervical pain or mid back pain with the flights. Some tightness in traps with everyday activities .  Hand dominance: Right  PERTINENT HISTORY:  Reports he has had intermittent back pains but has normally been able to self manage.  PAIN:  NPRS scale: at most 3/10, average 2/10 Pain location: neck, back Pain description: Tender to the touch Aggravating factors: Head down/bent positioning, walking for back Relieving factors: Arthritis tylenol , muscle relaxer   PRECAUTIONS: None   WEIGHT BEARING RESTRICTIONS: No  FALLS:  Has patient fallen in last 6 months? Yes. 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  DIAGNOSTIC FINDINGS:  08/07/23 Thoracic spine x-ray IMPRESSION: No acute fracture or dislocation. Chronic compression of T12 is identified not changed compared prior exam.  03/26/23 cervical spine x-ray IMPRESSION: No acute bony abnormality. Degenerative facet disease with multilevel bilateral neural foraminal narrowing.  03/26/23 lumbar spine x-ray IMPRESSION: Mild degenerative change without acute abnormality.  PATIENT SURVEYS:  The Patient-Specific Functional Scale  Activity Initial (eval): 08/30/2023 09/17/2023 10/11/2023  Walking for extended period of time 7  No number given today (haven't done') 8 or 9  2.   Driving for extended period of time  7 7 7  (I haven't done any)  3.   Working around the house 5 6 6   Average 19/3 = 6.33 6.5 7.2    POSTURE:  08/30/2023 rounded shoulders and increased thoracic kyphosis  PALPATION: 08/30/2023 No overt tenderness to palpation in C spine or periscapular muscles Mild tightness in thoracolumbar paraspinals  Tender with PA mobs along lower thoracic Hypomobile throughout mid thoracic to upper thoracic   CERVICAL ROM:   Active ROM A/PROM (deg) Eval 08/30/2023 AROM 09/17/2023 AROM 09/25/2023 AROM 10/11/2023  Flexion 60* (radiates to R shoulder) 60 62 60  Extension 40 52 53 55  Right lateral flexion 25*   35  Left lateral flexion 24*   25 pulls  Right rotation 40* 70 68 ( 72 post manual) 70  Left rotation 40 60 68 (72  post manual) 65   (Blank rows = not tested, * = pain)  UPPER EXTREMITY ROM:  08/30/2023 WNL  UPPER EXTREMITY MMT:  MMT Right Eval 08/30/2023 Left Eval 08/30/2023  Shoulder flexion 5 5  Shoulder extension 5 5  Shoulder abduction 4* 5  Shoulder adduction    Shoulder internal rotation 4+ 4+  Shoulder external rotation 4 4  Middle trapezius in prone 4- 4-  Lower trapezius in prone 3- 3   (Blank rows = not tested, * = pain)  CERVICAL SPECIAL TESTS:  08/30/2023 Neck flexor muscle endurance test: Positive, Spurling's test: Positive, and Distraction test: Negative  FUNCTIONAL TESTS:  08/30/2023 Did not assess                 TREATMENT         DATE:   10/30/23 There Ex:  UBE L 2 3 forward/3 backwards  Therapeutic Activity: Row blue TB with resistance  3x10 Bil Extension TB blue 3x10 Bicep curl blue TB with resistance at lowest position 3x10 Bent over row 15# KB 2x10 Wall plank + cervical retraction 2x10 Tband ER bilateral for thoracic ext and scapular retractions Blue  Manual Therapy: Manual UT  stretching bilaterally, DTM at medial UT R >  L     10/04/2023 Therex: Seated thoracic extension over chair 2-3 sec hold 2x 10  Manual Therapy: Seated thoracic rotation grade II to III MWM Seated thoracic ext grade II to III MWM STM & TPR cervical paraspinals, levator scap, UTs  Therapeutic Activity: Row blue TB with resistance at lowest position 2x10 Bicep curl blue TB with resistance at lowest position 2x10 Bent over row 15# KB 2x10   TREATMENT         DATE: 10/04/2023 Therex: UBE fwd/back UE only 4 mins each way with 1 min rest between, lvl 3.0  Seated thoracic extension over chair 2-3 sec hold 2x 10  Self Care: Theracane use  Manual Therapy: Seated thoracic rotation grade II to III MWM Seated thoracic ext grade II to III MWM  Neuro Re-ed: Wall plank + cervical retraction 2x10 Standing against wall diagonals + cervical retraction 2x10 Standing against wall shoulder flex + ER red TB 2x10  Therapeutic Activity: Bottoms up KB carry 10# 2x15' Overhead KB carry 10# 2x15' Overhead tricep extension 10# KB 2x10    TREATMENT         DATE: 10/02/2023 Therex: UBE fwd/back UE only 4 mins each way with 1 min rest between, lvl 3.0  Seated cervical retraction x10, with green TB x10 Seated cervical retraction + rotation green TB x10  Manual Therapy: Seated thoracic extension over chair 2-3 sec hold 2x 10 with MWM Cervical rotation + side glides grade II to III MWM Cervical lateral and PA mobs grade II to III for lateral flexion and ext MWM STM & TPR lateral cervical paraspinals, UT, levator scap  Neuro Re-ed: Seated shoulder diagonals red TB 2x10 Seated shoulder flex + ER red TB 2x10    TREATMENT         DATE: 09/27/2023 Therex: UBE fwd/back UE only 4 mins each way with 1 min rest between, lvl 3.0  Seated cervical rotation towel assisted 5 sec hold x 10 bilateral  Cervical extension towel assisted 2-3 sec hold x 10  Seated upper trap stretch 15 sec x 3 bilateral Seated thoracic extension over chair 2-3 sec hold  x 10    Neuro Re-ed(muscle activation, postural awareness/activation) Standing green band bilateral shoulder rows with scapular retraction focus 2 x 15  Standing green band gh ext bilateral 2 x 15  Seated bilateral shoudler ER c scapular retraction green band 2 x 15 Supine cervical retraction isometric hold 5 sec x 10 Supine horizontal abduction green band 2 x 15   TREATMENT         DATE: 09/25/2023 Therex: UBE fwd/back UE only 4 mins each way with 1 min rest between, lvl 3.0  Supine cervical rotation x 10 bilateral.  Seated cervical rotation towel assisted 5 sec hold x 5 bilateral  Seated upper trap stretch 15 sec x 3 bilateral Seated thoracic extension over chair 2-3 sec hold x 10  Time spent in technique education and performance.   Neuro Re-ed(muscle activation, postural awareness/activation) Supine cervical retraction into pillow isometric 5 sec hold x 10  Supine scapular retraction into table isometric 5 sec hold x 10  Supine bilateral gh ext isometric into table 5 sec hold x 10  Seated bilateral shoudler ER c scapular retraction green band 2 x 10  Time spent in technique education and performance.   Manual Supine cervical downslope mobs g3 C3-C7 bilaterally for joint mobility improvements.    TREATMENT  DATE: 09/21/2023 Therex: UBE fwd/back UE only 3 mins each way with 1 min rest between, lvl 2.5  Standing doorway pec stretch low, mid, high x30 each Upper trap Rt self stretch 2x30 Levator scap stretch 2x30 Seated thoracic ext x10  Neuro Re-ed Seated Cervical retraction 2x10 Cervical retraction + ext x10 Cervical retraction + rotation x10 W 2x10 Horizontal shoulder abd 90/90 2x10 Attempted serratus anterior + shoulder flexion   TREATMENT         DATE: 09/17/2023 Therex: UBE fwd/back UE only 3 mins each way with 1 min rest between, lvl 2.5  Upper trap Rt self stretch 15 sec x 3   Review of HEP additions /adjustments with handout provided.   Neuro  Re-ed(muscle activation, postural awareness/activation) Green band bilateral tband rows c scapular retraction focus 2 x 15  Green band bilateral GH ext 2 x 15 Review of scapular retraction c ER band exercise Seated cervical isometric retraction press into ball at wall 5 sec hold x 10 (added to home)  Manual Percussive device to Rt upper trap trigger point  PATIENT EDUCATION:  Eval; Education details: Exam findings, POC, initial HEP, MRI vs not Person educated: Patient Education method: Explanation, Demonstration, and Handouts Education comprehension: verbalized understanding, returned demonstration, and needs further education  HOME EXERCISE PROGRAM: Access Code: [email or web address] URL: [email or web address] Date: 09/17/2023 Prepared by: Ozell Silvan  Exercises - Seated Upper Trapezius Stretch  - 2 x daily - 7 x weekly - 1 sets - 5 reps - 15 hold - Supine Cervical Retraction with Towel  - 1 x daily - 7 x weekly - 1 sets - 10 reps - 3 sec hold - Cervical Retraction at Wall  - 2 x daily - 7 x weekly - 1 sets - 5-10 reps - 5 hold - Supine Deep Neck Flexor Training  - 1 x daily - 7 x weekly - 2 sets - 10 reps - 3 sec hold - Supine Lower Trunk Rotation  - 1 x daily - 7 x weekly - 1 sets - 5 reps - 10 sec hold - Supine Transversus Abdominis Bracing - Hands on Stomach  - 1 x daily - 7 x weekly - 2 sets - 10 reps - Shoulder W - External Rotation with Resistance  - 1 x daily - 7 x weekly - 2 sets - 10 reps - Standing Bilateral Low Shoulder Row with Anchored Resistance  - 1-2 x daily - 7 x weekly - 2-3 sets - 10-15 reps - Shoulder Extension with Resistance  - 1-2 x daily - 7 x weekly - 1-2 sets - 10-15 reps  ASSESSMENT:  CLINICAL IMPRESSION: Pt demonstrates good body mechanics with T band and kettle bell exercises.  Required VC for shoulder retraction and keeping elbows by his side with new Tband bilateral ER.  Pt demonstrated understanding.    OBJECTIVE IMPAIRMENTS: Abnormal gait,  decreased activity tolerance, decreased coordination, decreased endurance, decreased mobility, difficulty walking, decreased ROM, decreased strength, hypomobility, increased fascial restrictions, increased muscle spasms, improper body mechanics, postural dysfunction, and pain.    GOALS: Goals reviewed with patient? Yes  SHORT TERM GOALS: Target date: 09/20/2023   Pt will be ind with initial HEP Baseline:  Goal status: mostly met  2.  Pt will be able to perform TSA contraction without glute/LE compensation to demo improving core strength/activation Baseline:  Goal status: met    LONG TERM GOALS: Target date: 11/08/2023   Pt will be ind with management and progression of HEP Baseline:  Goal status: on going 10/11/2023  2.  Pt will have improved PSFS average score to >/=8.33 to demo MCID Baseline:  Goal status: on going 10/11/2023 - 7.12  3.  Pt will be able to perform all cervical ROM without pain Baseline:  Goal status: Met 10/11/2023 - some pulling with lateral flexion but no pain  4.  Pt will report >/=75% improvement in overall pain with activity Baseline:  Goal status: on going 10/11/2023 -- at least 30-40% improvement  5.  Pt will be able to lift and carry at least 25# without increased lumbar or cervical strain Baseline:  10/11/23: Was able to haul off and push 75# but increased pain. Able to perform 15# with good form in clinic with PT supervision and cueing Goal status: on going 10/11/2023  PLAN:  PT FREQUENCY: 1-2x/week  PT DURATION: 6 weeks  PLANNED INTERVENTIONS: [id number]- PT Re-evaluation, 97750- Physical Performance Testing, 97110-Therapeutic exercises, 97530- Therapeutic activity, 97112- Neuromuscular re-education, 97535- Self Care, [id number]- Manual therapy, [phone]- Gait training, [phone]- Aquatic Therapy, [phone]- Electrical stimulation (unattended), 97035- Ultrasound, [id number]- Traction (mechanical), [id number]- Ionotophoresis 4mg /ml Dexamethasone , Patient/Family education,  Balance training, Stair training, Taping, Dry Needling, Joint mobilization, Spinal mobilization, Cryotherapy, and Moist heat  PLAN FOR NEXT SESSION:  Work on Estate manager/land agent with heavy lifting/pushing/pulling   Burnard CHRISTELLA Meth, PT 10/30/23  3:59 PM

## 2023-11-02 ENCOUNTER — Ambulatory Visit: Admitting: Physical Therapy

## 2023-11-02 ENCOUNTER — Encounter: Payer: Self-pay | Admitting: Physical Therapy

## 2023-11-02 DIAGNOSIS — M549 Dorsalgia, unspecified: Secondary | ICD-10-CM

## 2023-11-02 DIAGNOSIS — R293 Abnormal posture: Secondary | ICD-10-CM

## 2023-11-02 DIAGNOSIS — R2689 Other abnormalities of gait and mobility: Secondary | ICD-10-CM | POA: Diagnosis not present

## 2023-11-02 DIAGNOSIS — M542 Cervicalgia: Secondary | ICD-10-CM | POA: Diagnosis not present

## 2023-11-02 DIAGNOSIS — M6281 Muscle weakness (generalized): Secondary | ICD-10-CM | POA: Diagnosis not present

## 2023-11-02 NOTE — Therapy (Signed)
 OUTPATIENT PHYSICAL THERAPY TREATMENT     Patient Name: Sergio Nelson MRN: [id number] DOB:31-Oct-1950, 73 y.o., male Today's Date: 11/02/2023  END OF SESSION:  PT End of Session - 11/02/23 1001     Visit Number 11    Number of Visits 15    Date for PT Re-Evaluation 11/08/23    Authorization Type Medicare    Progress Note Due on Visit 19   Updated to reflect Progress done on #9   PT Start Time 1005    PT Stop Time 1045    PT Time Calculation (min) 40 min    Activity Tolerance Patient tolerated treatment well    Behavior During Therapy [facility] for tasks assessed/performed           Past Medical History:  Diagnosis Date   Allergy    Arthritis    hands   Cataract    removed bilat 2021   GERD (gastroesophageal reflux disease)    Past Surgical History:  Procedure Laterality Date   CATARACT EXTRACTION Bilateral 2021   CHOLECYSTECTOMY N/A 10/17/2016   Procedure: LAPAROSCOPIC CHOLECYSTECTOMY WITH  INTRAOPERATIVE CHOLANGIOGRAM;  Surgeon: Curvin Deward MOULD, MD;  Location: [facility];  Service: General;  Laterality: N/A;   COLONOSCOPY     NERVE TRANSFER     on left elbow (ulnaar neuropathy)   POLYPECTOMY     Patient Active Problem List   Diagnosis Date Noted   Impacted cerumen of both ears 05/23/2023   Sensorineural hearing loss, bilateral 05/23/2023   Tinnitus of both ears 05/23/2023   Seasonal allergies 10/22/2017   Elevated blood pressure reading without diagnosis of hypertension 10/15/2016   Cholecystitis 10/15/2016   RUQ pain 10/12/2016   Chest pain 10/12/2016   Hyperlipidemia 10/12/2016   Routine general medical examination at a health care facility 07/15/2014    PCP: Duanne Butler DASEN, MD  REFERRING PROVIDER: Duanne Butler DASEN, MD  REFERRING DIAG: M54.9 (ICD-10-CM) - Mid back pain  THERAPY DIAG:  Mid back pain  Cervicalgia  Abnormal posture  Muscle weakness (generalized)  Other abnormalities of gait and mobility  Rationale for Evaluation and Treatment:  Rehabilitation  ONSET DATE: November 2024  SUBJECTIVE:                                                                                                                                                                                                         SUBJECTIVE STATEMENT: Pt states his neck is doing pretty good. No pain just stiffness. Has not been working on the cars right now and not doing any  heavy yard work.   Hand dominance: Right  PERTINENT HISTORY:  Reports he has had intermittent back pains but has normally been able to self manage.  PAIN:  NPRS scale: at most 3/10, average 2/10 Pain location: neck, back Pain description: Tender to the touch Aggravating factors: Head down/bent positioning, walking for back Relieving factors: Arthritis tylenol , muscle relaxer   PRECAUTIONS: None   WEIGHT BEARING RESTRICTIONS: No  FALLS:  Has patient fallen in last 6 months? Yes. 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  DIAGNOSTIC FINDINGS:  08/07/23 Thoracic spine x-ray IMPRESSION: No acute fracture or dislocation. Chronic compression of T12 is identified not changed compared prior exam.  03/26/23 cervical spine x-ray IMPRESSION: No acute bony abnormality. Degenerative facet disease with multilevel bilateral neural foraminal narrowing.  03/26/23 lumbar spine x-ray IMPRESSION: Mild degenerative change without acute abnormality.  PATIENT SURVEYS:  The Patient-Specific Functional Scale  Activity Initial (eval): 08/30/2023 09/17/2023 10/11/2023   Walking  for extended period of time 7 No number given today (haven't done') 8 or 9   2.   Driving for extended period of time  7 7 7  (I haven't done any)   3.   Working around the house 5 6 6    Average 19/3 = 6.33 6.5 7.2     POSTURE:  08/30/2023 rounded shoulders and increased thoracic kyphosis  PALPATION: 08/30/2023 No overt tenderness to palpation in C spine or periscapular muscles Mild tightness in thoracolumbar paraspinals  Tender with PA mobs along lower thoracic Hypomobile throughout mid thoracic to upper thoracic   CERVICAL ROM:   Active ROM A/PROM (deg) Eval 08/30/2023 AROM 09/17/2023 AROM 09/25/2023 AROM 10/11/2023  Flexion 60* (radiates to R shoulder) 60 62 60  Extension 40 52 53 55  Right lateral flexion 25*   35  Left lateral flexion 24*   25 pulls  Right rotation 40* 70 68 ( 72 post manual) 70  Left rotation 40 60 68 (72  post manual) 65   (Blank rows = not tested, * = pain)  UPPER EXTREMITY ROM:  08/30/2023 WNL  UPPER EXTREMITY MMT:  MMT Right Eval 08/30/2023 Left Eval 08/30/2023  Shoulder flexion 5 5  Shoulder extension 5 5  Shoulder abduction 4* 5  Shoulder adduction    Shoulder internal rotation 4+ 4+  Shoulder external rotation 4 4  Middle trapezius in prone 4- 4-  Lower trapezius in prone 3- 3   (Blank rows = not tested, * = pain)  CERVICAL SPECIAL TESTS:  08/30/2023 Neck flexor muscle endurance test: Positive, Spurling's test: Positive, and Distraction test: Negative  FUNCTIONAL TESTS:  08/30/2023 Did not assess                 TREATMENT         DATE:  11/02/23 There Ex:  UBE L 5 3 forward/3 backwards Seated thoracic ext x10  Therapeutic Activity: Bent over Row 20lbs at [facility]  3x10 (feet staggered L/R and then in squat position) Lat pull down 35 lbs at [facility] 2x10 Seated mid row 35lbs at [facility] 2x10 Table plank with Y 2x10  Table plank with T with cervical rotation 2x10 Snow angel against wall 2x5 focusing on maintaining scapula against  wall/tilted posteriorly  Manual Therapy: Manual UT stretching bilaterally, DTM cervical paraspinals and UTs Grade II to III cervical/thoracic rotation and PA mobs   TREATMENT         DATE:  10/30/23 There Ex:  UBE L 2 3 forward/3 backwards  Therapeutic Activity: Row blue TB with resistance  3x10 Bil Extension TB blue 3x10 Bicep curl blue TB with resistance at lowest position 3x10 Bent over row 15# KB 2x10 Wall plank + cervical retraction 2x10 Tband ER bilateral for thoracic ext and scapular retractions Blue  Manual Therapy: Manual UT stretching bilaterally, DTM at medial UT R > L    TREATMENT         DATE:  10/04/2023 Therex: Seated thoracic extension over chair 2-3 sec hold 2x 10  Manual Therapy: Seated thoracic rotation grade II to III MWM Seated thoracic ext grade II to III MWM STM & TPR cervical paraspinals, levator scap, UTs  Therapeutic Activity: Row blue TB with resistance at lowest position 2x10 Bicep curl blue TB with resistance at lowest position 2x10 Bent over row 15# KB 2x10   TREATMENT         DATE: 10/04/2023 Therex: UBE fwd/back UE only 4 mins each way with 1 min rest between, lvl 3.0  Seated thoracic extension over chair 2-3 sec hold 2x 10  Self Care: Theracane use  Manual Therapy: Seated thoracic rotation grade II to III MWM Seated thoracic ext grade II to III MWM  Neuro Re-ed: Wall plank + cervical retraction 2x10 Standing against wall diagonals + cervical retraction 2x10 Standing against wall shoulder flex + ER red TB 2x10  Therapeutic Activity: Bottoms up KB carry 10# 2x15' Overhead KB carry 10# 2x15' Overhead tricep extension 10# KB 2x10      PATIENT EDUCATION:  Education details: HEP Person educated: Patient Education method: Programmer, multimedia, Demonstration, and Handouts Education comprehension: verbalized understanding, returned demonstration, and needs further education  HOME EXERCISE PROGRAM: Access Code: [email or web address] URL:  [email or web address] Date: 09/17/2023 Prepared by: Ozell Silvan  Exercises - Seated Upper Trapezius Stretch  - 2 x daily - 7 x weekly - 1 sets - 5 reps - 15 hold - Supine Cervical Retraction with Towel  - 1 x daily - 7 x weekly - 1 sets - 10 reps - 3 sec hold - Cervical Retraction at Wall  - 2 x daily - 7 x weekly - 1 sets - 5-10 reps - 5 hold - Supine Deep Neck Flexor Training  - 1 x daily - 7 x weekly - 2 sets - 10 reps - 3 sec hold - Supine Lower Trunk Rotation  - 1 x daily - 7 x weekly - 1 sets - 5 reps - 10 sec hold - Supine Transversus Abdominis Bracing - Hands on Stomach  - 1 x daily - 7 x weekly - 2 sets - 10 reps - Shoulder W - External Rotation with Resistance  - 1 x daily - 7 x weekly - 2 sets - 10 reps - Standing Bilateral Low Shoulder Row with Anchored Resistance  - 1-2 x daily - 7 x weekly - 2-3 sets - 10-15 reps - Shoulder Extension with Resistance  - 1-2 x daily - 7 x weekly - 1-2 sets - 10-15 reps  ASSESSMENT:  CLINICAL IMPRESSION: Continued education on spine posture and overall body mechanics with his pushing/pulling/lifting activities at home. Able to progress  towards heavier weights/resistance with machine weights. Working on core stability with UE/midback strengthening this session. Challenged to maintain trunk extension and scapular stability against wall with snow angels this session. Pt is getting closer to d/c. Pt is not complaining of pain but mostly just stiffness still.   OBJECTIVE IMPAIRMENTS: Abnormal gait, decreased activity tolerance, decreased coordination, decreased endurance, decreased mobility, difficulty walking, decreased ROM, decreased strength, hypomobility, increased fascial restrictions, increased muscle spasms, improper body mechanics, postural dysfunction, and pain.    GOALS: Goals reviewed with patient? Yes  SHORT TERM GOALS: Target date: 09/20/2023   Pt will be ind with initial HEP Baseline:  Goal status: mostly met  2.   Pt will be able to perform TSA contraction without glute/LE compensation to demo improving core strength/activation Baseline:  Goal status: met    LONG TERM GOALS: Target date: 11/08/2023   Pt will be ind with management and progression of HEP Baseline:  Goal status: on going 10/11/2023  2.  Pt will have improved PSFS average score to >/=8.33 to demo MCID Baseline: 6.33  10/11/2023 - 7.12 Goal status: on going  3.  Pt will be able to perform all cervical ROM without pain Baseline:  Goal status: Met 10/11/2023 - some pulling with lateral flexion but no pain  4.  Pt will report >/=75% improvement in overall pain with activity Baseline:  10/11/2023 -- at least 30-40% improvement Goal status: on going   5.  Pt will be able to lift and carry at least 25# without increased lumbar or cervical strain Baseline:  10/11/23: Was able to haul off and push 75# but increased pain. Able to perform 15# with good form in clinic with PT supervision and cueing Goal status: on going 10/11/2023  PLAN:  PT FREQUENCY: 1-2x/week  PT DURATION: 6 weeks  PLANNED INTERVENTIONS: 97164- PT Re-evaluation, 97750- Physical Performance Testing, 97110-Therapeutic exercises, 97530- Therapeutic activity, 97112- Neuromuscular re-education, 97535- Self Care, 02859- Manual therapy, [id number]- Gait training, [phone]- Aquatic Therapy, [phone]- Electrical stimulation (unattended), 97035- Ultrasound, 02987- Traction (mechanical), [id number]- Ionotophoresis 4mg /ml Dexamethasone , Patient/Family education, Balance training, Stair training, Taping, Dry Needling, Joint mobilization, Spinal mobilization, Cryotherapy, and Moist heat  PLAN FOR NEXT SESSION:  Work on Estate manager/land agent with heavy lifting/pushing/pulling   Meleane Selinger April Ma L Lenny Bouchillon, PT 11/02/23  10:08 AM

## 2023-11-06 ENCOUNTER — Encounter: Payer: Self-pay | Admitting: Rehabilitative and Restorative Service Providers"

## 2023-11-06 ENCOUNTER — Ambulatory Visit (INDEPENDENT_AMBULATORY_CARE_PROVIDER_SITE_OTHER): Admitting: Rehabilitative and Restorative Service Providers"

## 2023-11-06 DIAGNOSIS — M542 Cervicalgia: Secondary | ICD-10-CM | POA: Diagnosis not present

## 2023-11-06 DIAGNOSIS — M6281 Muscle weakness (generalized): Secondary | ICD-10-CM

## 2023-11-06 DIAGNOSIS — M549 Dorsalgia, unspecified: Secondary | ICD-10-CM | POA: Diagnosis not present

## 2023-11-06 DIAGNOSIS — R293 Abnormal posture: Secondary | ICD-10-CM | POA: Diagnosis not present

## 2023-11-06 NOTE — Therapy (Signed)
 OUTPATIENT PHYSICAL THERAPY TREATMENT     Patient Name: Sergio Nelson MRN: [id number] DOB:1950/11/01, 73 y.o., male Today's Date: 11/06/2023  END OF SESSION:  PT End of Session - 11/06/23 1428     Visit Number 12    Number of Visits 15    Date for PT Re-Evaluation 11/08/23    Authorization Type Medicare    Progress Note Due on Visit 19   Updated to reflect Progress done on #9   PT Start Time 1428    PT Stop Time 1507    PT Time Calculation (min) 39 min    Activity Tolerance Patient tolerated treatment well    Behavior During Therapy [facility] for tasks assessed/performed            Past Medical History:  Diagnosis Date   Allergy    Arthritis    hands   Cataract    removed bilat 2021   GERD (gastroesophageal reflux disease)    Past Surgical History:  Procedure Laterality Date   CATARACT EXTRACTION Bilateral 2021   CHOLECYSTECTOMY N/A 10/17/2016   Procedure: LAPAROSCOPIC CHOLECYSTECTOMY WITH  INTRAOPERATIVE CHOLANGIOGRAM;  Surgeon: Curvin Deward MOULD, MD;  Location: [facility];  Service: General;  Laterality: N/A;   COLONOSCOPY     NERVE TRANSFER     on left elbow (ulnaar neuropathy)   POLYPECTOMY     Patient Active Problem List   Diagnosis Date Noted   Impacted cerumen of both ears 05/23/2023   Sensorineural hearing loss, bilateral 05/23/2023   Tinnitus of both ears 05/23/2023   Seasonal allergies 10/22/2017   Elevated blood pressure reading without diagnosis of hypertension 10/15/2016   Cholecystitis 10/15/2016   RUQ pain 10/12/2016   Chest pain 10/12/2016   Hyperlipidemia 10/12/2016   Routine general medical examination at a health care facility 07/15/2014    PCP: Duanne Butler DASEN, MD  REFERRING PROVIDER: Duanne Butler DASEN, MD  REFERRING DIAG: M54.9 (ICD-10-CM) - Mid back pain  THERAPY DIAG:  Mid back pain  Cervicalgia  Abnormal posture  Muscle weakness (generalized)  Rationale for Evaluation and Treatment: Rehabilitation  ONSET DATE: November  2024  SUBJECTIVE:                                                                                                                                                                                                         SUBJECTIVE STATEMENT: Pt indicated increased physical activity can still increase pain complaints such as working while bending over, etc.  Pt indicated improvement to normal around 85%.   Hand dominance: Right  PERTINENT HISTORY:  Reports he has had intermittent back pains but has normally been able to self manage.  PAIN:  NPRS scale: at worst in last few days:  6/10.  Upon arrival 3/10 Pain location: neck, back Pain description: Tender to the touch Aggravating factors: Head down/bent positioning, walking for back Relieving factors: Arthritis tylenol , muscle relaxer   PRECAUTIONS: None   WEIGHT BEARING RESTRICTIONS: No  FALLS:  Has patient fallen in last 6 months? Yes. 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  DIAGNOSTIC FINDINGS:  08/07/23 Thoracic spine x-ray IMPRESSION: No acute fracture or dislocation. Chronic compression of T12 is identified not changed compared prior exam.  03/26/23 cervical spine x-ray IMPRESSION: No acute bony abnormality. Degenerative facet disease with multilevel bilateral neural foraminal narrowing.  03/26/23 lumbar spine x-ray IMPRESSION: Mild degenerative change without acute abnormality.  PATIENT SURVEYS:  The Patient-Specific Functional Scale  Activity Initial (eval): 08/30/2023 09/17/2023 10/11/2023    Walking for extended period of time 7 No number given today (haven't done') 8 or 9   2.   Driving for extended period of time  7 7 7  (I haven't done any)   3.   Working around the house 5 6 6    Average 19/3 = 6.33 6.5 7.2     POSTURE:  08/30/2023 rounded shoulders and increased thoracic kyphosis  PALPATION: 08/30/2023 No overt tenderness to palpation in C spine or periscapular muscles Mild tightness in thoracolumbar paraspinals  Tender with PA mobs along lower thoracic Hypomobile throughout mid thoracic to upper thoracic   CERVICAL ROM:   Active ROM A/PROM (deg) Eval 08/30/2023 AROM 09/17/2023 AROM 09/25/2023 AROM 10/11/2023 AROM 11/06/2023  Flexion 60* (radiates to R shoulder) 60 62 60 62  Extension 40 52 53 55 52  Right lateral flexion 25*   35   Left lateral flexion 24*   25 pulls   Right rotation 40* 70 68 ( 72 post manual) 70 66  Left rotation 40 60 68 (72  post manual) 65 66   (Blank rows = not tested, * = pain)  UPPER EXTREMITY ROM:  08/30/2023 WNL  UPPER EXTREMITY MMT:  MMT Right Eval 08/30/2023 Left Eval 08/30/2023 Right 11/06/2023 Left 11/06/2023  Shoulder flexion 5 5    Shoulder extension 5 5    Shoulder abduction 4* 5    Shoulder adduction      Shoulder internal rotation 4+ 4+    Shoulder external rotation 4 4    Middle trapezius in prone 4- 4-    Lower trapezius in prone 3- 3     (Blank rows = not tested, * = pain)  CERVICAL SPECIAL TESTS:  08/30/2023 Neck flexor muscle endurance test: Positive, Spurling's test: Positive, and Distraction test: Negative  FUNCTIONAL TESTS:  08/30/2023 Did not assess                  TREATMENT         DATE: 11/06/2023 Ther Ex:  UBE fwd/back 3 mins each lvl 3.0 Seated cervical rotation c towel assist 5 sec hold x 10 bilateral Seated cervical rotation AROM  x 5 each way  Cues in review of existing HEP and techniques review. Additional time spent for review and updated HEP provided.   Neuro Re-ed (postural  awareness, recruitment) Standing blue band rows c scapular retraction 2 x 15  Standing blue band gh bilateral 2 x 15  Standing blue band bilateral shoulder ER c scapular retraction 2 x 15     TREATMENT         DATE: 11/02/23 There Ex:  UBE L 5 3 forward/3 backwards Seated thoracic ext x10  Therapeutic Activity: Bent over Row 20lbs at [employer]  3x10 (feet staggered L/R and then in squat position) Lat pull down 35 lbs at [facility] 2x10 Seated mid row 35lbs at [facility] 2x10 Table plank with Y 2x10 Table plank with T with cervical rotation 2x10 Snow angel against wall 2x5 focusing on maintaining scapula against wall/tilted posteriorly  Manual Therapy: Manual UT stretching bilaterally, DTM cervical paraspinals and UTs Grade II to III cervical/thoracic rotation and PA mobs   TREATMENT         DATE: 10/30/23 There Ex:  UBE L 2 3 forward/3 backwards  Therapeutic Activity: Row blue TB with resistance  3x10 Bil Extension TB blue 3x10 Bicep curl blue TB with resistance at lowest position 3x10 Bent over row 15# KB 2x10 Wall plank + cervical retraction 2x10 Tband ER bilateral for thoracic ext and scapular retractions Blue  Manual Therapy: Manual UT stretching bilaterally, DTM at medial UT R > L    TREATMENT         DATE: 10/04/2023 Therex: Seated thoracic extension over chair 2-3 sec hold 2x 10  Manual Therapy: Seated thoracic rotation grade II to III MWM Seated thoracic ext grade II to III MWM STM & TPR cervical paraspinals, levator scap, UTs  Therapeutic Activity: Row blue TB with resistance at lowest position 2x10 Bicep curl blue TB with resistance at lowest position 2x10 Bent over row 15# KB 2x10   TREATMENT         DATE: 10/04/2023 Therex: UBE fwd/back UE only 4 mins each way with 1 min rest between, lvl 3.0  Seated thoracic extension over chair 2-3 sec hold 2x 10  Self Care: Theracane use  Manual Therapy: Seated thoracic rotation grade II to III MWM Seated thoracic  ext grade II to III MWM  Neuro Re-ed: Wall plank + cervical retraction 2x10 Standing against wall diagonals + cervical retraction 2x10 Standing against wall shoulder flex + ER red TB 2x10  Therapeutic Activity: Bottoms up KB carry 10# 2x15' Overhead KB carry 10# 2x15' Overhead tricep extension 10# KB 2x10      PATIENT EDUCATION:  11/06/2023 Education details: HEP update  Person educated: Patient Education method: Explanation, Demonstration, and Handouts Education comprehension: verbalized understanding, returned demonstration, and needs further education  HOME EXERCISE PROGRAM: Access Code: [email or web address] URL: [email or web address] Date: 11/06/2023 Prepared by: Ozell Silvan  Exercises - Seated Upper Trapezius Stretch  - 1-2 x daily - 7 x weekly - 1 sets - 5 reps - 15 hold - Seated Levator Scapulae Stretch  - 1-2 x daily - 7 x weekly - 1 sets - 3-5 reps - 15 hold - Seated Assisted Cervical Rotation with Towel  - 1-2 x daily - 7 x weekly - 1 sets - 10 reps - 2-3 hold - Supine Cervical Retraction with Towel  - 1-2 x daily - 7 x weekly - 1 sets - 10 reps - 3 sec hold - Seated Scapular Retraction  - 2-3 x  daily - 7 x weekly - 1 sets - 10 reps - 3-5 hold - Cervical Retraction at Wall  - 1-2 x daily - 7 x weekly - 1 sets - 5-10 reps - 5 hold - Supine Lower Trunk Rotation  - 1-2 x daily - 7 x weekly - 1 sets - 5 reps - 10 sec hold - Supine Transversus Abdominis Bracing - Hands on Stomach  - 1 x daily - 7 x weekly - 2 sets - 10 reps - Shoulder W - External Rotation with Resistance  - 1-2 x daily - 7 x weekly - 2 sets - 10 reps - Standing Bilateral Low Shoulder Row with Anchored Resistance  - 1-2 x daily - 7 x weekly - 2-3 sets - 10-15 reps - Shoulder Extension with Resistance  - 1-2 x daily - 7 x weekly - 1-2 sets - 10-15 reps  ASSESSMENT:  CLINICAL IMPRESSION: Cervical range continued to show better than previous with maintaining gains noted.  Spent time in visit today  to review HEP and point out htose for pain relief vs. Strengthening.   Discussed early process thoughts on trial HEP transitioning based off presentation.  Patient will continue HEP and report back for future visit planning/adjustments.  Focus in clinic on mobility gains and postural endurance for activity tolerance improvements.   OBJECTIVE IMPAIRMENTS: Abnormal gait, decreased activity tolerance, decreased coordination, decreased endurance, decreased mobility, difficulty walking, decreased ROM, decreased strength, hypomobility, increased fascial restrictions, increased muscle spasms, improper body mechanics, postural dysfunction, and pain.    GOALS: Goals reviewed with patient? Yes  SHORT TERM GOALS: Target date: 09/20/2023   Pt will be ind with initial HEP Baseline:  Goal status: mostly met  2.  Pt will be able to perform TSA contraction without glute/LE compensation to demo improving core strength/activation Baseline:  Goal status: met    LONG TERM GOALS: Target date: 11/08/2023   Pt will be ind with management and progression of HEP Baseline:  Goal status: on going 10/11/2023  2.  Pt will have improved PSFS average score to >/=8.33 to demo MCID Baseline: 6.33  10/11/2023 - 7.12 Goal status: on going  3.  Pt will be able to perform all cervical ROM without pain Baseline:  Goal status: Met 10/11/2023 - some pulling with lateral flexion but no pain  4.  Pt will report >/=75% improvement in overall pain with activity Baseline:  10/11/2023 -- at least 30-40% improvement Goal status: on going   5.  Pt will be able to lift and carry at least 25# without increased lumbar or cervical strain Baseline:  10/11/23: Was able to haul off and push 75# but increased pain. Able to perform 15# with good form in clinic with PT supervision and cueing Goal status: on going 10/11/2023  PLAN:  PT FREQUENCY: 1-2x/week  PT DURATION: 6 weeks  PLANNED INTERVENTIONS: 97164- PT Re-evaluation,  97750- Physical Performance Testing, 97110-Therapeutic exercises, 97530- Therapeutic activity, [id number]- Neuromuscular re-education, 97535- Self Care, 02859- Manual therapy, [id number]- Gait training, [phone]- Aquatic Therapy, [id number]- Electrical stimulation (unattended), [id number]- Ultrasound, 02987- Traction (mechanical), [id number]- Ionotophoresis 4mg /ml Dexamethasone , Patient/Family education, Balance training, Stair training, Taping, Dry Needling, Joint mobilization, Spinal mobilization, Cryotherapy, and Moist heat  PLAN FOR NEXT SESSION:  Recert next visit required.    Ozell Silvan, PT, DPT, OCS, ATC 11/06/23  3:08 PM

## 2023-11-08 ENCOUNTER — Encounter: Payer: Self-pay | Admitting: Rehabilitative and Restorative Service Providers"

## 2023-11-08 ENCOUNTER — Ambulatory Visit (INDEPENDENT_AMBULATORY_CARE_PROVIDER_SITE_OTHER): Admitting: Rehabilitative and Restorative Service Providers"

## 2023-11-08 DIAGNOSIS — R293 Abnormal posture: Secondary | ICD-10-CM | POA: Diagnosis not present

## 2023-11-08 DIAGNOSIS — M6281 Muscle weakness (generalized): Secondary | ICD-10-CM | POA: Diagnosis not present

## 2023-11-08 DIAGNOSIS — M549 Dorsalgia, unspecified: Secondary | ICD-10-CM | POA: Diagnosis not present

## 2023-11-08 DIAGNOSIS — M542 Cervicalgia: Secondary | ICD-10-CM | POA: Diagnosis not present

## 2023-11-08 NOTE — Therapy (Signed)
 OUTPATIENT PHYSICAL THERAPY TREATMENT     Patient Name: Sergio Nelson MRN: [id number] DOB:10-13-1950, 73 y.o., male Today's Date: 11/08/2023  END OF SESSION:  PT End of Session - 11/08/23 1258     Visit Number 13    Number of Visits 15    Date for PT Re-Evaluation 11/08/23    Authorization Type Medicare    Progress Note Due on Visit 19   Updated to reflect Progress done on #9   PT Start Time 1258    PT Stop Time 1337    PT Time Calculation (min) 39 min    Activity Tolerance Patient tolerated treatment well    Behavior During Therapy [facility] for tasks assessed/performed             Past Medical History:  Diagnosis Date   Allergy    Arthritis    hands   Cataract    removed bilat 2021   GERD (gastroesophageal reflux disease)    Past Surgical History:  Procedure Laterality Date   CATARACT EXTRACTION Bilateral 2021   CHOLECYSTECTOMY N/A 10/17/2016   Procedure: LAPAROSCOPIC CHOLECYSTECTOMY WITH  INTRAOPERATIVE CHOLANGIOGRAM;  Surgeon: Curvin Deward MOULD, MD;  Location: [facility];  Service: General;  Laterality: N/A;   COLONOSCOPY     NERVE TRANSFER     on left elbow (ulnaar neuropathy)   POLYPECTOMY     Patient Active Problem List   Diagnosis Date Noted   Impacted cerumen of both ears 05/23/2023   Sensorineural hearing loss, bilateral 05/23/2023   Tinnitus of both ears 05/23/2023   Seasonal allergies 10/22/2017   Elevated blood pressure reading without diagnosis of hypertension 10/15/2016   Cholecystitis 10/15/2016   RUQ pain 10/12/2016   Chest pain 10/12/2016   Hyperlipidemia 10/12/2016   Routine general medical examination at a health care facility 07/15/2014    PCP: Duanne Butler DASEN, MD  REFERRING PROVIDER: Duanne Butler DASEN, MD  REFERRING DIAG: M54.9 (ICD-10-CM) - Mid back pain  THERAPY DIAG:  Mid back pain  Cervicalgia  Abnormal posture  Muscle weakness (generalized)  Rationale for Evaluation and Treatment: Rehabilitation  ONSET DATE: November  2024  SUBJECTIVE:                                                                                                                                                                                                         SUBJECTIVE STATEMENT: Pt indicated increased physical activity can still increase pain complaints such as working while bending over, etc.  Pt indicated improvement to normal around 85%.   Hand dominance: Right  PERTINENT HISTORY:  Reports he has had intermittent back pains but has normally been able to self manage.  PAIN:  NPRS scale: at worst in last few days:  6/10.  Upon arrival 3/10 Pain location: neck, back Pain description: Tender to the touch Aggravating factors: Head down/bent positioning, walking for back Relieving factors: Arthritis tylenol , muscle relaxer   PRECAUTIONS: None   WEIGHT BEARING RESTRICTIONS: No  FALLS:  Has patient fallen in last 6 months? Yes. 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  DIAGNOSTIC FINDINGS:  08/07/23 Thoracic spine x-ray IMPRESSION: No acute fracture or dislocation. Chronic compression of T12 is identified not changed compared prior exam.  03/26/23 cervical spine x-ray IMPRESSION: No acute bony abnormality. Degenerative facet disease with multilevel bilateral neural foraminal narrowing.  03/26/23 lumbar spine x-ray IMPRESSION: Mild degenerative change without acute abnormality.  PATIENT SURVEYS:  The Patient-Specific Functional Scale  Activity Initial (eval): 08/30/2023 09/17/2023 10/11/2023    Walking for extended period of time 7 No number given today (haven't done') 8 or 9   2.   Driving for extended period of time  7 7 7  (I haven't done any)   3.   Working around the house 5 6 6    Average 19/3 = 6.33 6.5 7.2     POSTURE:  08/30/2023 rounded shoulders and increased thoracic kyphosis  PALPATION: 08/30/2023 No overt tenderness to palpation in C spine or periscapular muscles Mild tightness in thoracolumbar paraspinals  Tender with PA mobs along lower thoracic Hypomobile throughout mid thoracic to upper thoracic   CERVICAL ROM:   Active ROM A/PROM (deg) Eval 08/30/2023 AROM 09/17/2023 AROM 09/25/2023 AROM 10/11/2023 AROM 11/06/2023  Flexion 60* (radiates to R shoulder) 60 62 60 62  Extension 40 52 53 55 52  Right lateral flexion 25*   35   Left lateral flexion 24*   25 pulls   Right rotation 40* 70 68 ( 72 post manual) 70 66  Left rotation 40 60 68 (72  post manual) 65 66   (Blank rows = not tested, * = pain)  UPPER EXTREMITY ROM:  08/30/2023 WNL  UPPER EXTREMITY MMT:  MMT Right Eval 08/30/2023 Left Eval 08/30/2023 Right 11/06/2023 Left 11/06/2023  Shoulder flexion 5 5    Shoulder extension 5 5    Shoulder abduction 4* 5    Shoulder adduction      Shoulder internal rotation 4+ 4+    Shoulder external rotation 4 4    Middle trapezius in prone 4- 4-    Lower trapezius in prone 3- 3     (Blank rows = not tested, * = pain)  CERVICAL SPECIAL TESTS:  08/30/2023 Neck flexor muscle endurance test: Positive, Spurling's test: Positive, and Distraction test: Negative  FUNCTIONAL TESTS:  08/30/2023 Did not assess                  TREATMENT         DATE: 11/08/2023 Manual Supine cervical downslope g3 mobs throughout cervical bilateral Prone upper and mid thoracic PA mobs g4, regional PA mob g4 mid thoracic  Ther Ex:  Supine cervical Lt and Rt rotation x 10 each Seated thoracic extension in chair 2-3 sec hold x 10  Standing thoracic rotation open book x 10  bilateral UBE fwd/back 4 mins each way with :10 second intervals at top of each minute with 1 min rest between directions.     Neuro Re-ed (postural awareness, recruitment) Supine cervical retraction isometric 5 sec hold x 10 Prone scapular retraction 5 sec hold x 10  Prone scapular retraction with GH ext 5 sec hold x 10   TREATMENT         DATE: 11/06/2023 Ther Ex:  UBE fwd/back 3 mins each lvl 3.0 Seated cervical rotation c towel assist 5 sec hold x 10 bilateral Seated cervical rotation AROM x 5 each way  Cues in review of existing HEP and techniques review. Additional time spent for review and updated HEP provided.   Neuro Re-ed (postural awareness, recruitment) Standing blue band rows c scapular retraction 2 x 15  Standing blue band gh bilateral 2 x 15  Standing blue band bilateral shoulder ER c scapular retraction 2 x 15     TREATMENT         DATE: 11/02/23 There Ex:  UBE L 5 3 forward/3 backwards Seated thoracic ext x10  Therapeutic Activity: Bent over Row 20lbs at [employer]  3x10 (feet staggered L/R and then in squat position) Lat pull down 35 lbs at BATCA 2x10 Seated mid row 35lbs at BATCA 2x10 Table plank with Y 2x10 Table plank with T with cervical rotation 2x10 Snow angel against wall 2x5 focusing on maintaining scapula against wall/tilted posteriorly  Manual Therapy: Manual UT stretching bilaterally, DTM cervical paraspinals and UTs Grade II to III cervical/thoracic rotation and PA mobs   TREATMENT         DATE: 10/30/23 There Ex:  UBE L 2 3 forward/3 backwards  Therapeutic Activity: Row blue TB with resistance  3x10 Bil Extension TB blue 3x10 Bicep curl blue TB with resistance at lowest position 3x10 Bent over row 15# KB 2x10 Wall plank + cervical retraction 2x10 Tband ER bilateral for thoracic ext and scapular retractions Blue  Manual Therapy: Manual UT stretching bilaterally, DTM at medial UT R > L    TREATMENT         DATE:  10/04/2023 Therex: Seated thoracic extension over chair 2-3 sec hold 2x 10  Manual Therapy: Seated thoracic rotation grade II to III MWM Seated thoracic ext grade II to III MWM STM & TPR cervical paraspinals, levator scap, UTs  Therapeutic Activity: Row blue TB with resistance at lowest position 2x10 Bicep curl blue TB with resistance at lowest position 2x10 Bent over row 15# KB 2x10    PATIENT EDUCATION:  11/06/2023 Education details: HEP update  Person educated: Patient Education method: Explanation, Demonstration, and Handouts Education comprehension: verbalized understanding, returned demonstration, and needs further education  HOME EXERCISE PROGRAM: Access Code: [email or web address] URL: [email or web address] Date: 11/06/2023 Prepared by: Ozell Silvan  Exercises - Seated Upper Trapezius Stretch  - 1-2 x daily - 7 x weekly - 1 sets - 5 reps - 15 hold - Seated Levator Scapulae Stretch  - 1-2 x daily - 7 x weekly - 1 sets - 3-5 reps - 15 hold - Seated Assisted Cervical Rotation with Towel  - 1-2 x daily - 7 x weekly - 1 sets - 10 reps - 2-3 hold - Supine Cervical Retraction with Towel  - 1-2 x daily - 7 x weekly - 1 sets - 10  reps - 3 sec hold - Seated Scapular Retraction  - 2-3 x daily - 7 x weekly - 1 sets - 10 reps - 3-5 hold - Cervical Retraction at Wall  - 1-2 x daily - 7 x weekly - 1 sets - 5-10 reps - 5 hold - Supine Lower Trunk Rotation  - 1-2 x daily - 7 x weekly - 1 sets - 5 reps - 10 sec hold - Supine Transversus Abdominis Bracing - Hands on Stomach  - 1 x daily - 7 x weekly - 2 sets - 10 reps - Shoulder W - External Rotation with Resistance  - 1-2 x daily - 7 x weekly - 2 sets - 10 reps - Standing Bilateral Low Shoulder Row with Anchored Resistance  - 1-2 x daily - 7 x weekly - 2-3 sets - 10-15 reps - Shoulder Extension with Resistance  - 1-2 x daily - 7 x weekly - 1-2 sets - 10-15 reps  ASSESSMENT:  CLINICAL IMPRESSION: Supine cervical downslope mobs  showed mild restriction in mid cervical with more noted tightness in cervicothoracic and thoracic region.  Discussed possible reduced frequency of visits, Pt seemed unsure due to times of still experiencing symptoms with increased activity load. Success has been noted with use of HEP.   OBJECTIVE IMPAIRMENTS: Abnormal gait, decreased activity tolerance, decreased coordination, decreased endurance, decreased mobility, difficulty walking, decreased ROM, decreased strength, hypomobility, increased fascial restrictions, increased muscle spasms, improper body mechanics, postural dysfunction, and pain.    GOALS: Goals reviewed with patient? Yes  SHORT TERM GOALS: Target date: 09/20/2023   Pt will be ind with initial HEP Baseline:  Goal status: mostly met  2.  Pt will be able to perform TSA contraction without glute/LE compensation to demo improving core strength/activation Baseline:  Goal status: met    LONG TERM GOALS: Target date: 11/08/2023   Pt will be ind with management and progression of HEP Baseline:  Goal status: on going 10/11/2023  2.  Pt will have improved PSFS average score to >/=8.33 to demo MCID Baseline: 6.33  10/11/2023 - 7.12 Goal status: on going  3.  Pt will be able to perform all cervical ROM without pain Baseline:  Goal status: Met 10/11/2023 - some pulling with lateral flexion but no pain  4.  Pt will report >/=75% improvement in overall pain with activity Baseline:  10/11/2023 -- at least 30-40% improvement Goal status: on going   5.  Pt will be able to lift and carry at least 25# without increased lumbar or cervical strain Baseline:  10/11/23: Was able to haul off and push 75# but increased pain. Able to perform 15# with good form in clinic with PT supervision and cueing Goal status: on going 10/11/2023  PLAN:  PT FREQUENCY: 1-2x/week  PT DURATION: 6 weeks  PLANNED INTERVENTIONS: [id number]- PT Re-evaluation, 97750- Physical Performance Testing,  97110-Therapeutic exercises, 97530- Therapeutic activity, [id number]- Neuromuscular re-education, 97535- Self Care, [id number]- Manual therapy, [id number]- Gait training, [phone]- Aquatic Therapy, [phone]- Electrical stimulation (unattended), [id number]- Ultrasound, [id number]- Traction (mechanical), [id number]- Ionotophoresis 4mg /ml Dexamethasone , Patient/Family education, Balance training, Stair training, Taping, Dry Needling, Joint mobilization, Spinal mobilization, Cryotherapy, and Moist heat  PLAN FOR NEXT SESSION:  Today was actually within POC date.  Plan for recert next visit for extension of POC accordingly.    Ozell Silvan, PT, DPT, OCS, ATC 11/08/23  1:37 PM

## 2023-11-13 ENCOUNTER — Encounter: Payer: Self-pay | Admitting: Rehabilitative and Restorative Service Providers"

## 2023-11-13 ENCOUNTER — Ambulatory Visit (INDEPENDENT_AMBULATORY_CARE_PROVIDER_SITE_OTHER): Admitting: Rehabilitative and Restorative Service Providers"

## 2023-11-13 DIAGNOSIS — M542 Cervicalgia: Secondary | ICD-10-CM | POA: Diagnosis not present

## 2023-11-13 DIAGNOSIS — R2689 Other abnormalities of gait and mobility: Secondary | ICD-10-CM

## 2023-11-13 DIAGNOSIS — R293 Abnormal posture: Secondary | ICD-10-CM

## 2023-11-13 DIAGNOSIS — M6281 Muscle weakness (generalized): Secondary | ICD-10-CM | POA: Diagnosis not present

## 2023-11-13 DIAGNOSIS — M549 Dorsalgia, unspecified: Secondary | ICD-10-CM

## 2023-11-13 NOTE — Therapy (Signed)
 OUTPATIENT PHYSICAL THERAPY TREATMENT / PROGRESS NOTE/ RECERT    Patient Name: Sergio Nelson MRN: [id number] DOB:[birth year], 73 y.o., male Today's Date: 11/13/2023  Progress Note Reporting Period 10/11/2023 to 11/13/2023  See note below for Objective Data and Assessment of Progress/Goals.      END OF SESSION:  PT End of Session - 11/13/23 1143     Visit Number 14    Number of Visits 24    Date for PT Re-Evaluation 12/25/23    Authorization Type Medicare    Progress Note Due on Visit 24   Updated to reflect Progress done on #9   PT Start Time 1139    PT Stop Time 1219    PT Time Calculation (min) 40 min    Activity Tolerance Patient tolerated treatment well    Behavior During Therapy [facility] for tasks assessed/performed              Past Medical History:  Diagnosis Date   Allergy    Arthritis    hands   Cataract    removed bilat 2021   GERD (gastroesophageal reflux disease)    Past Surgical History:  Procedure Laterality Date   CATARACT EXTRACTION Bilateral 2021   CHOLECYSTECTOMY N/A 10/17/2016   Procedure: LAPAROSCOPIC CHOLECYSTECTOMY WITH  INTRAOPERATIVE CHOLANGIOGRAM;  Surgeon: Curvin Deward MOULD, MD;  Location: [facility];  Service: General;  Laterality: N/A;   COLONOSCOPY     NERVE TRANSFER     on left elbow (ulnaar neuropathy)   POLYPECTOMY     Patient Active Problem List   Diagnosis Date Noted   Impacted cerumen of both ears 05/23/2023   Sensorineural hearing loss, bilateral 05/23/2023   Tinnitus of both ears 05/23/2023   Seasonal allergies 10/22/2017   Elevated blood pressure reading without diagnosis of hypertension 10/15/2016   Cholecystitis 10/15/2016   RUQ pain 10/12/2016   Chest pain 10/12/2016   Hyperlipidemia 10/12/2016   Routine general medical examination at a health care facility 07/15/2014    PCP: Duanne Butler DASEN, MD  REFERRING PROVIDER: Duanne Butler DASEN, MD  REFERRING DIAG: M54.9 (ICD-10-CM) - Mid back pain  THERAPY DIAG:  Mid back  pain  Cervicalgia  Abnormal posture  Muscle weakness (generalized)  Other abnormalities of gait and mobility  Rationale for Evaluation and Treatment: Rehabilitation  ONSET DATE: November 2024  SUBJECTIVE:  SUBJECTIVE STATEMENT: Pt indicated improvement to normal around 85%.  Pt indicated feeling some neck pain with working on the mower activity.  Reported 5/10 at worst in last week.   Reported day after last visit was noted.    Hand dominance: Right  PERTINENT HISTORY:  Reports he has had intermittent back pains but has normally been able to self manage.  PAIN:  NPRS scale: at worst in last few days:  5/10  Pain location: neck, back Pain description: Tender to the touch Aggravating factors: Head down/bent positioning, walking for back Relieving factors: Arthritis tylenol , muscle relaxer   PRECAUTIONS: None   WEIGHT BEARING RESTRICTIONS: No  FALLS:  Has patient fallen in last 6 months? Yes. 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  DIAGNOSTIC FINDINGS:  08/07/23 Thoracic spine x-ray IMPRESSION: No acute fracture or dislocation. Chronic compression of T12 is identified not changed compared prior exam.  03/26/23 cervical spine x-ray IMPRESSION: No acute bony abnormality. Degenerative facet disease with multilevel bilateral neural foraminal narrowing.  03/26/23 lumbar spine x-ray  IMPRESSION: Mild degenerative change without acute abnormality.  PATIENT SURVEYS:  The Patient-Specific Functional Scale  Activity Initial (eval): 08/30/2023 09/17/2023 10/11/2023 11/13/2023  Walking for extended period of time 7 No number given today (haven't done') 8 or 9 10  2.   Driving for extended period of time  7 7 7  (I haven't done any) 8  3.   Working around the house 5 6 6 8   Average 19/3 = 6.33 6.5 7.2 8.667    POSTURE:  08/30/2023 rounded shoulders and increased thoracic kyphosis  PALPATION: 08/30/2023 No overt tenderness to palpation in C spine or periscapular muscles Mild tightness in thoracolumbar paraspinals  Tender with PA mobs along lower thoracic Hypomobile throughout mid thoracic to upper thoracic   CERVICAL ROM:   Active ROM A/PROM (deg) Eval 08/30/2023 AROM 09/17/2023 AROM 09/25/2023 AROM 10/11/2023 AROM 11/06/2023  Flexion 60* (radiates to R shoulder) 60 62 60 62  Extension 40 52 53 55 52  Right lateral flexion 25*   35   Left lateral flexion 24*   25 pulls   Right rotation 40* 70 68 ( 72 post manual) 70 66  Left rotation 40 60 68 (72  post manual) 65 66   (Blank rows = not tested, * = pain)  UPPER EXTREMITY ROM:  08/30/2023 WNL  UPPER EXTREMITY MMT:  MMT Right Eval 08/30/2023 Left Eval 08/30/2023 Right 11/06/2023 Left 11/06/2023  Shoulder flexion 5 5    Shoulder extension 5 5    Shoulder abduction 4* 5    Shoulder adduction      Shoulder internal rotation 4+ 4+    Shoulder external rotation 4 4    Middle trapezius in prone 4- 4-    Lower trapezius in prone 3- 3     (Blank rows = not tested, * = pain)  CERVICAL SPECIAL TESTS:  08/30/2023 Neck flexor muscle endurance test: Positive, Spurling's test: Positive, and Distraction test: Negative  FUNCTIONAL TESTS:  08/30/2023 Did not assess                  TREATMENT  DATE: 11/13/2023 Therex: UBE fwd/back 3 mins each way with 1 min rest between direction lvl 3.0 Review of existing  HEP for mobility gains.    Neuro Re-Ed Standing blue band bilateral rows c scapular retraction focus 2 x 15 Standing high band attachment blue bilateral 2 x 15  Seated on stool blue band lat pull down c focus on scapular control 2 x 15 Supine cervical retraction isometric hold 10 sec x 5   Pt. Education on myofascial pain trigger points with education on pain neuroscience on how trigger point pain may play role of current presention.  Reveiwed merits of STM, percussive device and DN education for possible treatment options.   Manual Percussive device bilateral upper trap, levators STM.   Self Care Reviewed education on percussive device use at home c cues for use (5 mins or less to limit soreness), Use encouraged PRN based off improvement from use.       TREATMENT         DATE: 11/08/2023 Manual Supine cervical downslope g3 mobs throughout cervical bilateral Prone upper and mid thoracic PA mobs g4, regional PA mob g4 mid thoracic  Ther Ex:  Supine cervical Lt and Rt rotation x 10 each Seated thoracic extension in chair 2-3 sec hold x 10  Standing thoracic rotation open book x 10 bilateral UBE fwd/back 4 mins each way with :10 second intervals at top of each minute with 1 min rest between directions.     Neuro Re-ed (postural awareness, recruitment) Supine cervical retraction isometric 5 sec hold x 10 Prone scapular retraction 5 sec hold x 10  Prone scapular retraction with GH ext 5 sec hold x 10   TREATMENT         DATE: 11/06/2023 Ther Ex:  UBE fwd/back 3 mins each lvl 3.0 Seated cervical rotation c towel assist 5 sec hold x 10 bilateral Seated cervical rotation AROM x 5 each way  Cues in review of existing HEP and techniques review. Additional time spent for review and updated HEP provided.   Neuro Re-ed (postural awareness, recruitment) Standing blue band rows c scapular retraction 2 x 15  Standing blue band gh bilateral 2 x 15  Standing blue band bilateral shoulder  ER c scapular retraction 2 x 15     TREATMENT         DATE: 11/02/23 There Ex:  UBE L 5 3 forward/3 backwards Seated thoracic ext x10  Therapeutic Activity: Bent over Row 20lbs at [employer]  3x10 (feet staggered L/R and then in squat position) Lat pull down 35 lbs at BATCA 2x10 Seated mid row 35lbs at BATCA 2x10 Table plank with Y 2x10 Table plank with T with cervical rotation 2x10 Snow angel against wall 2x5 focusing on maintaining scapula against wall/tilted posteriorly  Manual Therapy: Manual UT stretching bilaterally, DTM cervical paraspinals and UTs Grade II to III cervical/thoracic rotation and PA mobs  PATIENT EDUCATION:  11/06/2023 Education details: HEP update  Person educated: Patient Education method: Explanation, Demonstration, and Handouts Education comprehension: verbalized understanding, returned demonstration, and needs further education  HOME EXERCISE PROGRAM: Access Code: [email or web address] URL: [email or web address] Date: 11/06/2023 Prepared by: Ozell Silvan  Exercises - Seated Upper Trapezius Stretch  - 1-2 x daily - 7 x weekly - 1 sets - 5 reps - 15 hold - Seated Levator Scapulae Stretch  - 1-2 x daily - 7 x weekly - 1 sets - 3-5 reps - 15 hold - Seated Assisted Cervical Rotation with Towel  - 1-2  x daily - 7 x weekly - 1 sets - 10 reps - 2-3 hold - Supine Cervical Retraction with Towel  - 1-2 x daily - 7 x weekly - 1 sets - 10 reps - 3 sec hold - Seated Scapular Retraction  - 2-3 x daily - 7 x weekly - 1 sets - 10 reps - 3-5 hold - Cervical Retraction at Wall  - 1-2 x daily - 7 x weekly - 1 sets - 5-10 reps - 5 hold - Supine Lower Trunk Rotation  - 1-2 x daily - 7 x weekly - 1 sets - 5 reps - 10 sec hold - Supine Transversus Abdominis Bracing - Hands on Stomach  - 1 x daily - 7 x weekly - 2 sets - 10 reps - Shoulder W - External Rotation with Resistance  - 1-2 x daily - 7 x weekly - 2 sets - 10 reps - Standing Bilateral Low Shoulder Row with  Anchored Resistance  - 1-2 x daily - 7 x weekly - 2-3 sets - 10-15 reps - Shoulder Extension with Resistance  - 1-2 x daily - 7 x weekly - 1-2 sets - 10-15 reps  ASSESSMENT:  CLINICAL IMPRESSION: The patient has attended 14 visits over the course of treatment cycle.  Patient has reported overall improvement at 70%.   See objective data above for updated information regarding current presentation.  Continued complaints of cervical and back related symptoms which can impact daily activity tolerance and performance.  Review of existing HEP today with focus on postural improvements as able to help reduce strain on impacted areas.  Discussed myofascial release options including percussive device and dry needling for possible use in future.  Medical necessity for continued skilled PT services indicated at this time.    OBJECTIVE IMPAIRMENTS: Abnormal gait, decreased activity tolerance, decreased coordination, decreased endurance, decreased mobility, difficulty walking, decreased ROM, decreased strength, hypomobility, increased fascial restrictions, increased muscle spasms, improper body mechanics, postural dysfunction, and pain.    GOALS: Goals reviewed with patient? Yes  SHORT TERM GOALS: Target date: 09/20/2023   Pt will be ind with initial HEP Baseline:  Goal status: mostly met  2.  Pt will be able to perform TSA contraction without glute/LE compensation to demo improving core strength/activation Baseline:  Goal status: met    LONG TERM GOALS: Target date: 12/25/2023    Pt will be ind with management and progression of HEP Baseline:  Goal status: on going 11/13/2023  2.  Pt will have improved PSFS average score to >/=8.33 to demo MCID  Goal status: Met 11/13/2023  3.  Pt will be able to perform all cervical ROM without pain  Goal status: on going 11/13/2023  4.  Pt will report >/=75% improvement in overall pain with activity  Goal status: on going 11/13/2023  5.  Pt will be able  to lift and carry at least 25# without increased lumbar or cervical strain  Goal status: on going 11/13/2023  PLAN:  PT FREQUENCY: 1-2x/week  PT DURATION: 6 weeks  PLANNED INTERVENTIONS: 97164- PT Re-evaluation, 97750- Physical Performance Testing, 97110-Therapeutic exercises, 97530- Therapeutic activity, [id number]- Neuromuscular re-education, 97535- Self Care, 02859- Manual therapy, [id number]- Gait training, [phone]- Aquatic Therapy, [phone]- Electrical stimulation (unattended), [id number]- Ultrasound, 02987- Traction (mechanical), [id number]- Ionotophoresis 4mg /ml Dexamethasone , Patient/Family education, Balance training, Stair training, Taping, Dry Needling, Joint mobilization, Spinal mobilization, Cryotherapy, and Moist heat  PLAN FOR NEXT SESSION:  How was percussive device.  Continue to improve cervico/thoracic mobility.  Ozell Silvan, PT, DPT, OCS, ATC 11/13/23  12:19 PM

## 2023-11-15 ENCOUNTER — Encounter: Payer: Self-pay | Admitting: Rehabilitative and Restorative Service Providers"

## 2023-11-15 ENCOUNTER — Ambulatory Visit (INDEPENDENT_AMBULATORY_CARE_PROVIDER_SITE_OTHER): Admitting: Rehabilitative and Restorative Service Providers"

## 2023-11-15 DIAGNOSIS — R293 Abnormal posture: Secondary | ICD-10-CM

## 2023-11-15 DIAGNOSIS — M6281 Muscle weakness (generalized): Secondary | ICD-10-CM | POA: Diagnosis not present

## 2023-11-15 DIAGNOSIS — M542 Cervicalgia: Secondary | ICD-10-CM

## 2023-11-15 DIAGNOSIS — M549 Dorsalgia, unspecified: Secondary | ICD-10-CM | POA: Diagnosis not present

## 2023-11-15 NOTE — Therapy (Signed)
 OUTPATIENT PHYSICAL THERAPY TREATMENT    Patient Name: Sergio Nelson MRN: [id number] DOB:07-07-1950, 73 y.o., male Today's Date: 11/15/2023    END OF SESSION:  PT End of Session - 11/15/23 1059     Visit Number 15    Number of Visits 24    Date for PT Re-Evaluation 12/25/23    Authorization Type Medicare    Progress Note Due on Visit 24   Updated to reflect Progress done on #9   PT Start Time 1054    PT Stop Time 1133    PT Time Calculation (min) 39 min    Activity Tolerance Patient tolerated treatment well    Behavior During Therapy [facility] for tasks assessed/performed               Past Medical History:  Diagnosis Date   Allergy    Arthritis    hands   Cataract    removed bilat 2021   GERD (gastroesophageal reflux disease)    Past Surgical History:  Procedure Laterality Date   CATARACT EXTRACTION Bilateral 2021   CHOLECYSTECTOMY N/A 10/17/2016   Procedure: LAPAROSCOPIC CHOLECYSTECTOMY WITH  INTRAOPERATIVE CHOLANGIOGRAM;  Surgeon: Curvin Deward MOULD, MD;  Location: [facility];  Service: General;  Laterality: N/A;   COLONOSCOPY     NERVE TRANSFER     on left elbow (ulnaar neuropathy)   POLYPECTOMY     Patient Active Problem List   Diagnosis Date Noted   Impacted cerumen of both ears 05/23/2023   Sensorineural hearing loss, bilateral 05/23/2023   Tinnitus of both ears 05/23/2023   Seasonal allergies 10/22/2017   Elevated blood pressure reading without diagnosis of hypertension 10/15/2016   Cholecystitis 10/15/2016   RUQ pain 10/12/2016   Chest pain 10/12/2016   Hyperlipidemia 10/12/2016   Routine general medical examination at a health care facility 07/15/2014    PCP: Duanne Butler DASEN, MD  REFERRING PROVIDER: Duanne Butler DASEN, MD  REFERRING DIAG: M54.9 (ICD-10-CM) - Mid back pain  THERAPY DIAG:  Mid back pain  Cervicalgia  Abnormal posture  Muscle weakness (generalized)  Rationale for Evaluation and Treatment: Rehabilitation  ONSET DATE: November  2024  SUBJECTIVE:                                                                                                                                                                                                         SUBJECTIVE STATEMENT: Pt indicated feeling 2-3/10 or so today and yesterday.  Reported feeling some improvement with massage gun and reported less soreness from use.   Hand dominance:  Right  PERTINENT HISTORY:  Reports he has had intermittent back pains but has normally been able to self manage.  PAIN:  NPRS scale: at worst in last few days:  2-3/10 Pain location: neck, back Pain description: Tender to the touch Aggravating factors: Head down/bent positioning, walking for back Relieving factors: Arthritis tylenol , muscle relaxer   PRECAUTIONS: None   WEIGHT BEARING RESTRICTIONS: No  FALLS:  Has patient fallen in last 6 months? Yes. 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  DIAGNOSTIC FINDINGS:  08/07/23 Thoracic spine x-ray IMPRESSION: No acute fracture or dislocation. Chronic compression of T12 is identified not changed compared prior exam.  03/26/23 cervical spine x-ray IMPRESSION: No acute bony abnormality. Degenerative facet disease with multilevel bilateral neural foraminal narrowing.  03/26/23 lumbar spine x-ray IMPRESSION: Mild degenerative change without acute abnormality.  PATIENT SURVEYS:  The Patient-Specific Functional Scale  Activity Initial (eval): 08/30/2023 09/17/2023 10/11/2023 11/13/2023  Walking for extended period of time 7 No number given today (haven't  done') 8 or 9 10  2.   Driving for extended period of time  7 7 7  (I haven't done any) 8  3.   Working around the house 5 6 6 8   Average 19/3 = 6.33 6.5 7.2 8.667    POSTURE:  08/30/2023 rounded shoulders and increased thoracic kyphosis  PALPATION: 08/30/2023 No overt tenderness to palpation in C spine or periscapular muscles Mild tightness in thoracolumbar paraspinals  Tender with PA mobs along lower thoracic Hypomobile throughout mid thoracic to upper thoracic   CERVICAL ROM:   Active ROM A/PROM (deg) Eval 08/30/2023 AROM 09/17/2023 AROM 09/25/2023 AROM 10/11/2023 AROM 11/06/2023  Flexion 60* (radiates to R shoulder) 60 62 60 62  Extension 40 52 53 55 52  Right lateral flexion 25*   35   Left lateral flexion 24*   25 pulls   Right rotation 40* 70 68 ( 72 post manual) 70 66  Left rotation 40 60 68 (72  post manual) 65 66   (Blank rows = not tested, * = pain)  UPPER EXTREMITY ROM:  08/30/2023 WNL  UPPER EXTREMITY MMT:  MMT Right Eval 08/30/2023 Left Eval 08/30/2023 Right  Left   Shoulder flexion 5 5    Shoulder extension 5 5    Shoulder abduction 4* 5    Shoulder adduction      Shoulder internal rotation 4+ 4+    Shoulder external rotation 4 4    Middle trapezius in prone 4- 4-    Lower trapezius in prone 3- 3     (Blank rows = not tested, * = pain)  CERVICAL SPECIAL TESTS:  08/30/2023 Neck flexor muscle endurance test: Positive, Spurling's test: Positive, and Distraction test: Negative  FUNCTIONAL TESTS:  08/30/2023 Did not assess                  TREATMENT         DATE: 11/15/2023 Therex: UBE fwd/back 3 mins each way with 1 min rest between direction lvl 3.0 Seated cervical rotation with towel stretch 2-3 sec hold x 10 bilateral Seated cervical extension with towel stretch 2-3 sec  hold x 10 throughout cervical levels  Discussed use of towel for cervical and thoracic mobility while at home.   Neuro Re-Ed Seated cervical reactive isometric hold into  yellow ball 5 sec x 10  Standing thoracic rotation with horizontal abduction green band x 15 bilaterally  Standing back at wall cervical retraction x 10 for posture awareness.   Manual Percussive device bilateral upper trap, levators STM.  Seated cervical downslope mobs Lt and Rt g4 for mobility gains.  Seated G4 cPA T3-T7.    TREATMENT         DATE: 11/13/2023 Therex: UBE fwd/back 3 mins each way with 1 min rest between direction lvl 3.0 Review of existing HEP for mobility gains.    Neuro Re-Ed Standing blue band bilateral rows c scapular retraction focus 2 x 15 Standing high band attachment blue bilateral 2 x 15  Seated on stool blue band lat pull down c focus on scapular control 2 x 15 Supine cervical retraction isometric hold 10 sec x 5   Pt. Education on myofascial pain trigger points with education on pain neuroscience on how trigger point pain may play role of current presention.  Reveiwed merits of STM, percussive device and DN education for possible treatment options.   Manual Percussive device bilateral upper trap, levators STM.   Self Care Reviewed education on percussive device use at home c cues for use (5 mins or less to limit soreness), Use encouraged PRN based off improvement from use.     TREATMENT         DATE: 11/08/2023 Manual Supine cervical downslope g3 mobs throughout cervical bilateral Prone upper and mid thoracic PA mobs g4, regional PA mob g4 mid thoracic  Ther Ex:  Supine cervical Lt and Rt rotation x 10 each Seated thoracic extension in chair 2-3 sec hold x 10  Standing thoracic rotation open book x 10 bilateral UBE fwd/back 4 mins each way with :10 second intervals at top of each minute with 1 min rest between directions.     Neuro Re-ed (postural awareness, recruitment) Supine cervical retraction isometric 5 sec hold x 10 Prone scapular retraction 5 sec hold x 10  Prone scapular retraction with GH ext 5 sec hold x 10   TREATMENT          DATE: 11/06/2023 Ther Ex:  UBE fwd/back 3 mins each lvl 3.0 Seated cervical rotation c towel assist 5 sec hold x 10 bilateral Seated cervical rotation AROM x 5 each way  Cues in review of existing HEP and techniques review. Additional time spent for review and updated HEP provided.   Neuro Re-ed (postural awareness, recruitment) Standing blue band rows c scapular retraction 2 x 15  Standing blue band gh bilateral 2 x 15  Standing blue band bilateral shoulder ER c scapular retraction 2 x 15  PATIENT EDUCATION:  11/06/2023 Education details: HEP update  Person educated: Patient Education method: Explanation, Demonstration, and Handouts Education comprehension: verbalized understanding, returned demonstration, and needs further education  HOME EXERCISE PROGRAM: Access Code: [email or web address] URL: [email or web address] Date: 11/06/2023 Prepared by: Ozell Silvan  Exercises - Seated Upper Trapezius Stretch  - 1-2 x daily - 7 x weekly - 1 sets - 5 reps - 15 hold - Seated Levator Scapulae Stretch  - 1-2 x daily - 7 x weekly - 1 sets - 3-5 reps - 15 hold - Seated Assisted Cervical Rotation with Towel  - 1-2 x daily - 7 x weekly - 1 sets - 10 reps -  2-3 hold - Supine Cervical Retraction with Towel  - 1-2 x daily - 7 x weekly - 1 sets - 10 reps - 3 sec hold - Seated Scapular Retraction  - 2-3 x daily - 7 x weekly - 1 sets - 10 reps - 3-5 hold - Cervical Retraction at Wall  - 1-2 x daily - 7 x weekly - 1 sets - 5-10 reps - 5 hold - Supine Lower Trunk Rotation  - 1-2 x daily - 7 x weekly - 1 sets - 5 reps - 10 sec hold - Supine Transversus Abdominis Bracing - Hands on Stomach  - 1 x daily - 7 x weekly - 2 sets - 10 reps - Shoulder W - External Rotation with Resistance  - 1-2 x daily - 7 x weekly - 2 sets - 10 reps - Standing Bilateral Low Shoulder Row with Anchored Resistance  - 1-2 x daily - 7 x weekly - 2-3 sets - 10-15 reps - Shoulder Extension with Resistance  - 1-2 x daily -  7 x weekly - 1-2 sets - 10-15 reps  ASSESSMENT:  CLINICAL IMPRESSION: Repeated percussive device use as indications of some improvements reported.  Reviewed and detailed HEP use and massage gun at home to try to help respond to symptom response in work activity to help address symptoms change.  Also reviewed breaks and adjustment to avoid prolonged forward head positioning.     OBJECTIVE IMPAIRMENTS: Abnormal gait, decreased activity tolerance, decreased coordination, decreased endurance, decreased mobility, difficulty walking, decreased ROM, decreased strength, hypomobility, increased fascial restrictions, increased muscle spasms, improper body mechanics, postural dysfunction, and pain.    GOALS: Goals reviewed with patient? Yes  SHORT TERM GOALS: Target date: 09/20/2023   Pt will be ind with initial HEP Baseline:  Goal status: mostly met  2.  Pt will be able to perform TSA contraction without glute/LE compensation to demo improving core strength/activation Baseline:  Goal status: met    LONG TERM GOALS: Target date: 12/25/2023    Pt will be ind with management and progression of HEP Baseline:  Goal status: on going 11/13/2023  2.  Pt will have improved PSFS average score to >/=8.33 to demo MCID  Goal status: Met 11/13/2023  3.  Pt will be able to perform all cervical ROM without pain  Goal status: on going 11/13/2023  4.  Pt will report >/=75% improvement in overall pain with activity  Goal status: on going 11/13/2023  5.  Pt will be able to lift and carry at least 25# without increased lumbar or cervical strain  Goal status: on going 11/13/2023  PLAN:  PT FREQUENCY: 1-2x/week  PT DURATION: 6 weeks  PLANNED INTERVENTIONS: 97164- PT Re-evaluation, 97750- Physical Performance Testing, 97110-Therapeutic exercises, 97530- Therapeutic activity, [id number]- Neuromuscular re-education, 97535- Self Care, 02859- Manual therapy, [id number]- Gait training, [phone]- Aquatic Therapy, [phone]-  Electrical stimulation (unattended), 97035- Ultrasound, 02987- Traction (mechanical), [id number]- Ionotophoresis 4mg /ml Dexamethasone , Patient/Family education, Balance training, Stair training, Taping, Dry Needling, Joint mobilization, Spinal mobilization, Cryotherapy, and Moist heat  PLAN FOR NEXT SESSION:  Follow up on car work response.    Ozell Silvan, PT, DPT, OCS, ATC 11/15/23  11:35 AM

## 2023-11-20 ENCOUNTER — Encounter: Payer: Self-pay | Admitting: Rehabilitative and Restorative Service Providers"

## 2023-11-20 ENCOUNTER — Ambulatory Visit (INDEPENDENT_AMBULATORY_CARE_PROVIDER_SITE_OTHER): Admitting: Rehabilitative and Restorative Service Providers"

## 2023-11-20 DIAGNOSIS — M542 Cervicalgia: Secondary | ICD-10-CM | POA: Diagnosis not present

## 2023-11-20 DIAGNOSIS — R293 Abnormal posture: Secondary | ICD-10-CM | POA: Diagnosis not present

## 2023-11-20 DIAGNOSIS — M549 Dorsalgia, unspecified: Secondary | ICD-10-CM

## 2023-11-20 DIAGNOSIS — M6281 Muscle weakness (generalized): Secondary | ICD-10-CM | POA: Diagnosis not present

## 2023-11-20 NOTE — Therapy (Addendum)
 OUTPATIENT PHYSICAL THERAPY TREATMENT/DISCHARGE    Patient Name: Sergio Nelson MRN: [id number] DOB:07/13/1950, 73 y.o., male Today's Date: 11/20/2023   PHYSICAL THERAPY DISCHARGE SUMMARY  Visits from Start of Care: 16  Current functional level related to goals / functional outcomes: See below   Remaining deficits: See below   Education / Equipment: HEP   Patient agrees to discharge. Patient goals were partially met. Patient is being discharged due to not returning since the last visit.  END OF SESSION:  PT End of Session - 11/20/23 1153     Visit Number 16    Number of Visits 24    Date for PT Re-Evaluation 12/25/23    Authorization Type Medicare    Progress Note Due on Visit 24   Updated to reflect Progress done on #9   PT Start Time 1150    PT Stop Time 1228    PT Time Calculation (min) 38 min    Activity Tolerance Patient tolerated treatment well    Behavior During Therapy [facility] for tasks assessed/performed            Past Medical History:  Diagnosis Date   Allergy    Arthritis    hands   Cataract    removed bilat 2021   GERD (gastroesophageal reflux disease)    Past Surgical History:  Procedure Laterality Date   CATARACT EXTRACTION Bilateral 2021   CHOLECYSTECTOMY N/A 10/17/2016   Procedure: LAPAROSCOPIC CHOLECYSTECTOMY WITH  INTRAOPERATIVE CHOLANGIOGRAM;  Surgeon: Curvin Deward MOULD, MD;  Location: [facility];  Service: General;  Laterality: N/A;   COLONOSCOPY     NERVE TRANSFER     on left elbow (ulnaar neuropathy)   POLYPECTOMY     Patient Active Problem List   Diagnosis Date Noted   Impacted cerumen of both ears 05/23/2023   Sensorineural hearing loss, bilateral 05/23/2023   Tinnitus of both ears 05/23/2023   Seasonal allergies 10/22/2017   Elevated blood pressure reading without diagnosis of hypertension 10/15/2016   Cholecystitis 10/15/2016   RUQ pain 10/12/2016   Chest pain 10/12/2016   Hyperlipidemia 10/12/2016   Routine general medical  examination at a health care facility 07/15/2014    PCP: Duanne Butler DASEN, MD  REFERRING PROVIDER: Duanne Butler DASEN, MD  REFERRING DIAG: M54.9 (ICD-10-CM) - Mid back pain  THERAPY DIAG:  Mid back pain  Cervicalgia  Abnormal posture  Muscle weakness (generalized)  Rationale for Evaluation and Treatment: Rehabilitation  ONSET DATE: November 2024  SUBJECTIVE:  SUBJECTIVE STATEMENT: Pt indicated good feelings today and reported feeling the best in a while. Reported doing pretty well from weekend. Had some things noticed but nothing major.   Hand dominance: Right  PERTINENT HISTORY:  Reports he has had intermittent back pains but has normally been able to self manage.  PAIN:  NPRS scale: at worst in last few days:  2-3/10 Pain location: neck, back Pain description: Tender to the touch Aggravating factors: Head down/bent positioning, walking for back Relieving factors: Arthritis tylenol , muscle relaxer   PRECAUTIONS: None   WEIGHT BEARING RESTRICTIONS: No  FALLS:  Has patient fallen in last 6 months? Yes. 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  DIAGNOSTIC FINDINGS:  08/07/23 Thoracic spine x-ray IMPRESSION: No acute fracture or dislocation. Chronic compression of T12 is identified not changed compared prior exam.  03/26/23 cervical spine x-ray IMPRESSION: No acute bony  abnormality. Degenerative facet disease with multilevel bilateral neural foraminal narrowing.  03/26/23 lumbar spine x-ray IMPRESSION: Mild degenerative change without acute abnormality.  PATIENT SURVEYS:  The Patient-Specific Functional Scale  Activity Initial (eval): 08/30/2023 09/17/2023 10/11/2023 11/13/2023  Walking for extended period of time 7 No number given today (haven't done') 8 or 9 10  2.   Driving for extended period of time  7 7 7  (I haven't done any) 8  3.   Working around the house 5 6 6 8   Average 19/3 = 6.33 6.5 7.2 8.667    POSTURE:  08/30/2023 rounded shoulders and increased thoracic kyphosis  PALPATION: 08/30/2023 No overt tenderness to palpation in C spine or periscapular muscles Mild tightness in thoracolumbar paraspinals  Tender with PA mobs along lower thoracic Hypomobile throughout mid thoracic to upper thoracic   CERVICAL ROM:   Active ROM A/PROM (deg) Eval 08/30/2023 AROM 09/17/2023 AROM 09/25/2023 AROM 10/11/2023 AROM 11/06/2023 AROM 11/20/2023  Flexion 60* (radiates to R shoulder) 60 62 60 62 58  Extension 40 52 53 55 52 60  Right lateral flexion 25*   35    Left lateral flexion 24*   25 pulls    Right rotation 40* 70 68 ( 72 post manual) 70 66 65  Left rotation 40 60 68 (72  post manual) 65 66 65   (Blank rows = not tested, * = pain)  UPPER EXTREMITY ROM:  08/30/2023 WNL  UPPER EXTREMITY MMT:  MMT Right Eval 08/30/2023 Left Eval 08/30/2023 Right  Left   Shoulder flexion 5 5    Shoulder extension 5 5    Shoulder abduction 4* 5    Shoulder adduction      Shoulder internal rotation 4+ 4+    Shoulder external rotation 4 4    Middle trapezius in prone 4- 4-    Lower trapezius in prone 3- 3     (Blank rows = not tested, * = pain)  CERVICAL SPECIAL TESTS:  08/30/2023 Neck flexor muscle endurance test: Positive, Spurling's test: Positive, and Distraction test: Negative  FUNCTIONAL TESTS:  08/30/2023 Did not  assess                  TREATMENT  DATE: 11/20/2023 Therex: UBE fwd 4 mins, backwards 4 mins, lvl 3.0  Cervical rotation with towel assist x 10 bilateral Cervical extension with towel assist x 10 Review of HEP for continued use.    Neuro Re-Ed Tband rows blue with scapular retraction 2 x 15 Standing blue band GH ext 2 x 15     Manual Percussive device bilateral upper trap, levators STM.  Seated cervical downslope mobs Lt and Rt g4 for mobility gains.  Seated G4 cPA T3-T7.     TREATMENT         DATE: 11/15/2023 Therex: UBE fwd/back 3 mins each way with 1 min rest between direction lvl 3.0 Seated cervical rotation with towel stretch 2-3 sec hold x 10 bilateral Seated cervical extension with towel stretch 2-3 sec hold x 10 throughout cervical levels  Discussed use of towel for cervical and thoracic mobility while at home.   Neuro Re-Ed Seated cervical reactive isometric hold into yellow ball 5 sec x 10  Standing thoracic rotation with horizontal abduction green band x 15 bilaterally  Standing back at wall cervical retraction x 10 for posture awareness.   Manual Percussive device bilateral upper trap, levators STM.  Seated cervical downslope mobs Lt and Rt g4 for mobility gains.  Seated G4 cPA T3-T7.    TREATMENT         DATE: 11/13/2023 Therex: UBE fwd/back 3 mins each way with 1 min rest between direction lvl 3.0 Review of existing HEP for mobility gains.    Neuro Re-Ed Standing blue band bilateral rows c scapular retraction focus 2 x 15 Standing high band attachment blue bilateral 2 x 15  Seated on stool blue band lat pull down c focus on scapular control 2 x 15 Supine cervical retraction isometric hold 10 sec x 5   Pt. Education on myofascial pain trigger points with education on pain neuroscience on how trigger point pain may play role of current presention.  Reveiwed merits of STM, percussive device and DN education for possible treatment options.    Manual Percussive device bilateral upper trap, levators STM.   Self Care Reviewed education on percussive device use at home c cues for use (5 mins or less to limit soreness), Use encouraged PRN based off improvement from use.     TREATMENT         DATE: 11/08/2023 Manual Supine cervical downslope g3 mobs throughout cervical bilateral Prone upper and mid thoracic PA mobs g4, regional PA mob g4 mid thoracic  Ther Ex:  Supine cervical Lt and Rt rotation x 10 each Seated thoracic extension in chair 2-3 sec hold x 10  Standing thoracic rotation open book x 10 bilateral UBE fwd/back 4 mins each way with :10 second intervals at top of each minute with 1 min rest between directions.     Neuro Re-ed (postural awareness, recruitment) Supine cervical retraction isometric 5 sec hold x 10 Prone scapular retraction 5 sec hold x 10  Prone scapular retraction with GH ext 5 sec hold x 10    PATIENT EDUCATION:  11/06/2023 Education details: HEP update  Person educated: Patient Education method: Explanation, Demonstration, and Handouts Education comprehension: verbalized understanding, returned demonstration, and needs further education  HOME EXERCISE PROGRAM: Access Code: [email or web address] URL: [email or web address] Date: 11/06/2023 Prepared by: Ozell Silvan  Exercises - Seated Upper Trapezius Stretch  - 1-2 x daily - 7 x weekly - 1 sets - 5 reps - 15 hold - Seated Levator Scapulae Stretch  - 1-2 x daily -  7 x weekly - 1 sets - 3-5 reps - 15 hold - Seated Assisted Cervical Rotation with Towel  - 1-2 x daily - 7 x weekly - 1 sets - 10 reps - 2-3 hold - Supine Cervical Retraction with Towel  - 1-2 x daily - 7 x weekly - 1 sets - 10 reps - 3 sec hold - Seated Scapular Retraction  - 2-3 x daily - 7 x weekly - 1 sets - 10 reps - 3-5 hold - Cervical Retraction at Wall  - 1-2 x daily - 7 x weekly - 1 sets - 5-10 reps - 5 hold - Supine Lower Trunk Rotation  - 1-2 x daily - 7 x weekly -  1 sets - 5 reps - 10 sec hold - Supine Transversus Abdominis Bracing - Hands on Stomach  - 1 x daily - 7 x weekly - 2 sets - 10 reps - Shoulder W - External Rotation with Resistance  - 1-2 x daily - 7 x weekly - 2 sets - 10 reps - Standing Bilateral Low Shoulder Row with Anchored Resistance  - 1-2 x daily - 7 x weekly - 2-3 sets - 10-15 reps - Shoulder Extension with Resistance  - 1-2 x daily - 7 x weekly - 1-2 sets - 10-15 reps  ASSESSMENT:  CLINICAL IMPRESSION: Good general report noted since last visit.  Pt to go out of town and will focus on HEP with evaluation on ability to transition to HEP in future.  Mobility has continued to show maintenance since gains since eval.    OBJECTIVE IMPAIRMENTS: Abnormal gait, decreased activity tolerance, decreased coordination, decreased endurance, decreased mobility, difficulty walking, decreased ROM, decreased strength, hypomobility, increased fascial restrictions, increased muscle spasms, improper body mechanics, postural dysfunction, and pain.    GOALS: Goals reviewed with patient? Yes  SHORT TERM GOALS: Target date: 09/20/2023   Pt will be ind with initial HEP Baseline:  Goal status: mostly met  2.  Pt will be able to perform TSA contraction without glute/LE compensation to demo improving core strength/activation Baseline:  Goal status: met    LONG TERM GOALS: Target date: 12/25/2023    Pt will be ind with management and progression of HEP Baseline:  Goal status: on going 11/13/2023  2.  Pt will have improved PSFS average score to >/=8.33 to demo MCID  Goal status: Met 11/13/2023  3.  Pt will be able to perform all cervical ROM without pain  Goal status: on going 11/13/2023  4.  Pt will report >/=75% improvement in overall pain with activity  Goal status: on going 11/13/2023  5.  Pt will be able to lift and carry at least 25# without increased lumbar or cervical strain  Goal status: on going 11/13/2023  PLAN:  PT FREQUENCY:  1-2x/week  PT DURATION: 6 weeks  PLANNED INTERVENTIONS: 97164- PT Re-evaluation, 97750- Physical Performance Testing, 97110-Therapeutic exercises, 97530- Therapeutic activity, 97112- Neuromuscular re-education, 97535- Self Care, [id number]- Manual therapy, [phone]- Gait training, [phone]- Aquatic Therapy, [phone]- Electrical stimulation (unattended), 97035- Ultrasound, 02987- Traction (mechanical), [id number]- Ionotophoresis 4mg /ml Dexamethasone , Patient/Family education, Balance training, Stair training, Taping, Dry Needling, Joint mobilization, Spinal mobilization, Cryotherapy, and Moist heat  PLAN FOR NEXT SESSION:  Follow up on HEP transitioning   Ozell Silvan, PT, DPT, OCS, ATC 11/20/23  12:26 PM

## 2023-11-22 ENCOUNTER — Encounter: Admitting: Rehabilitative and Restorative Service Providers"

## 2023-12-07 ENCOUNTER — Other Ambulatory Visit: Payer: Medicare Other

## 2023-12-07 ENCOUNTER — Other Ambulatory Visit: Payer: Self-pay | Admitting: Family Medicine

## 2023-12-07 DIAGNOSIS — Z0000 Encounter for general adult medical examination without abnormal findings: Secondary | ICD-10-CM

## 2023-12-07 DIAGNOSIS — R030 Elevated blood-pressure reading, without diagnosis of hypertension: Secondary | ICD-10-CM | POA: Diagnosis not present

## 2023-12-07 DIAGNOSIS — E785 Hyperlipidemia, unspecified: Secondary | ICD-10-CM

## 2023-12-07 DIAGNOSIS — Z13220 Encounter for screening for lipoid disorders: Secondary | ICD-10-CM | POA: Diagnosis not present

## 2023-12-07 DIAGNOSIS — R350 Frequency of micturition: Secondary | ICD-10-CM | POA: Diagnosis not present

## 2023-12-07 DIAGNOSIS — Z125 Encounter for screening for malignant neoplasm of prostate: Secondary | ICD-10-CM

## 2023-12-08 LAB — CBC WITH DIFFERENTIAL/PLATELET
Absolute Lymphocytes: 3010 {cells}/uL (ref 850–3900)
Absolute Monocytes: 937 {cells}/uL (ref 200–950)
Basophils Absolute: 78 {cells}/uL (ref 0–200)
Basophils Relative: 1.1 %
Eosinophils Absolute: 454 {cells}/uL (ref 15–500)
Eosinophils Relative: 6.4 %
HCT: 46.3 % (ref 38.5–50.0)
Hemoglobin: 16.1 g/dL (ref 13.2–17.1)
MCH: 34.3 pg — ABNORMAL HIGH (ref 27.0–33.0)
MCHC: 34.8 g/dL (ref 32.0–36.0)
MCV: 98.5 fL (ref 80.0–100.0)
MPV: 9.1 fL (ref 7.5–12.5)
Monocytes Relative: 13.2 %
Neutro Abs: 2620 {cells}/uL (ref 1500–7800)
Neutrophils Relative %: 36.9 %
Platelets: 250 Thousand/uL (ref 140–400)
RBC: 4.7 Million/uL (ref 4.20–5.80)
RDW: 12.8 % (ref 11.0–15.0)
Total Lymphocyte: 42.4 %
WBC: 7.1 Thousand/uL (ref 3.8–10.8)

## 2023-12-08 LAB — LIPID PANEL
Cholesterol: 151 mg/dL (ref <200)
HDL: 49 mg/dL (ref >=40)
LDL Cholesterol (Calc): 88 mg/dL
Non-HDL Cholesterol (Calc): 102 mg/dL (ref <130)
Total CHOL/HDL Ratio: 3.1 (calc) (ref <5.0)
Triglycerides: 49 mg/dL (ref <150)

## 2023-12-08 LAB — COMPLETE METABOLIC PANEL WITHOUT GFR
AG Ratio: 2.4 (calc) (ref 1.0–2.5)
ALT: 12 U/L (ref 9–46)
AST: 18 U/L (ref 10–35)
Albumin: 4 g/dL (ref 3.6–5.1)
Alkaline phosphatase (APISO): 41 U/L (ref 35–144)
BUN: 12 mg/dL (ref 7–25)
CO2: 25 mmol/L (ref 20–32)
Calcium: 8.8 mg/dL (ref 8.6–10.3)
Chloride: 109 mmol/L (ref 98–110)
Creat: 0.72 mg/dL (ref 0.70–1.28)
Globulin: 1.7 g/dL — ABNORMAL LOW (ref 1.9–3.7)
Glucose, Bld: 74 mg/dL (ref 65–99)
Potassium: 4.2 mmol/L (ref 3.5–5.3)
Sodium: 142 mmol/L (ref 135–146)
Total Bilirubin: 1.3 mg/dL — ABNORMAL HIGH (ref 0.2–1.2)
Total Protein: 5.7 g/dL — ABNORMAL LOW (ref 6.1–8.1)

## 2023-12-08 LAB — PSA: PSA: 0.41 ng/mL (ref <=4.00)

## 2023-12-10 ENCOUNTER — Ambulatory Visit: Payer: Self-pay | Admitting: Family Medicine

## 2023-12-10 ENCOUNTER — Ambulatory Visit (INDEPENDENT_AMBULATORY_CARE_PROVIDER_SITE_OTHER): Payer: Medicare Other | Admitting: Family Medicine

## 2023-12-10 ENCOUNTER — Encounter: Payer: Self-pay | Admitting: Family Medicine

## 2023-12-10 VITALS — BP 126/82 | HR 81 | Temp 97.5°F | Ht 68.0 in | Wt 196.0 lb

## 2023-12-10 DIAGNOSIS — Z0000 Encounter for general adult medical examination without abnormal findings: Secondary | ICD-10-CM

## 2023-12-10 DIAGNOSIS — R911 Solitary pulmonary nodule: Secondary | ICD-10-CM | POA: Diagnosis not present

## 2023-12-10 NOTE — Telephone Encounter (Signed)
 Requested medications are due for refill today.  yes  Requested medications are on the active medications list.  yes  Last refill. 09/27/2023 #30 2 rf  Future visit scheduled.   yes  Notes to clinic.  Refill not delegated.    Requested Prescriptions  Pending Prescriptions Disp Refills   cyclobenzaprine  (FLEXERIL ) 10 MG tablet [Pharmacy Med Name: cyclobenzaprine  10 mg tablet] 30 tablet 2    Sig: Take 1 tablet (10 mg total) by mouth 3 (three) times daily as needed for muscle spasms.     Not Delegated - Analgesics:  Muscle Relaxants Failed - 12/10/2023  7:58 AM      Failed - This refill cannot be delegated      Passed - Valid encounter within last 6 months    Recent Outpatient Visits           4 months ago Mid back pain   [place] [facility] Duanne, Butler DASEN, MD   8 months ago Coccydynia   [place] [facility] Duanne, Butler DASEN, MD   1 year ago Routine general medical examination at a health care facility   [place] [facility] Duanne Butler DASEN, MD   1 year ago Bee sting reaction, accidental or unintentional, initial encounter   [place] [facility] Kayla Jeoffrey RAMAN, FNP   2 years ago Routine general medical examination at a health care facility   [facility] Pickard, Butler DASEN, MD       Future Appointments             Today Pickard, Butler DASEN, MD  [facility]

## 2023-12-10 NOTE — Progress Notes (Signed)
 Subjective:    Patient ID: Sergio Nelson, male    DOB: 14-Apr-1951, 73 y.o.   MRN: [id number]  HPI  Patient is a very pleasant 85--year-old Caucasian male who is here today for CPE.  Patient actually is my godfather whom I have known my entire life.  Had colonoscopy 10/22 which showed 2 polyps and Dr. Leigh recommended repeating in 5 years.  His most recent PSA was outstanding.  He denies any chest pain shortness of breath or dyspnea on exertion.  A CT scan of his chest in May revealed a 7 mm pulmonary nodule.  I recommended repeating a CT scan in 6 months.  I need to schedule that today.  Otherwise he is doing well Immunization History  Administered Date(s) Administered   INFLUENZA, HIGH DOSE SEASONAL PF 12/20/2017   Influenza-Unspecified 12/20/2017   PFIZER(Purple Top)SARS-COV-2 Vaccination 05/07/2019, 05/28/2019, 01/26/2020, 09/08/2020   Pneumococcal Conjugate-13 12/03/2017   Pneumococcal Polysaccharide-23 10/21/2020   Tdap 07/15/2014   Zoster Recombinant(Shingrix) 06/27/2018, 11/26/2018   Lab on 12/07/2023  Component Date Value Ref Range Status   WBC 12/07/2023 7.1  3.8 - 10.8 Thousand/uL Final   RBC 12/07/2023 4.70  4.20 - 5.80 Million/uL Final   Hemoglobin 12/07/2023 16.1  13.2 - 17.1 g/dL Final   HCT 91/77/7974 46.3  38.5 - 50.0 % Final   MCV 12/07/2023 98.5  80.0 - 100.0 fL Final   MCH 12/07/2023 34.3 (H)  27.0 - 33.0 pg Final   MCHC 12/07/2023 34.8  32.0 - 36.0 g/dL Final   Comment: For adults, a slight decrease in the calculated MCHC value (in the range of 30 to 32 g/dL) is most likely not clinically significant; however, it should be interpreted with caution in correlation with other red cell parameters and the patient's clinical condition.    RDW 12/07/2023 12.8  11.0 - 15.0 % Final   Platelets 12/07/2023 250  140 - 400 Thousand/uL Final   MPV 12/07/2023 9.1  7.5 - 12.5 fL Final   Neutro Abs 12/07/2023 2,620  1,500 - 7,800 cells/uL Final   Absolute Lymphocytes  12/07/2023 3,010  850 - 3,900 cells/uL Final   Absolute Monocytes 12/07/2023 937  200 - 950 cells/uL Final   Eosinophils Absolute 12/07/2023 454  15 - 500 cells/uL Final   Basophils Absolute 12/07/2023 78  0 - 200 cells/uL Final   Neutrophils Relative % 12/07/2023 36.9  % Final   Total Lymphocyte 12/07/2023 42.4  % Final   Monocytes Relative 12/07/2023 13.2  % Final   Eosinophils Relative 12/07/2023 6.4  % Final   Basophils Relative 12/07/2023 1.1  % Final   Glucose, Bld 12/07/2023 74  65 - 99 mg/dL Final   Comment: .            Fasting reference interval .    BUN 12/07/2023 12  7 - 25 mg/dL Final   Creat 91/77/7974 0.72  0.70 - 1.28 mg/dL Final   BUN/Creatinine Ratio 12/07/2023 SEE NOTE:  6 - 22 (calc) Final   Comment:    Not Reported: BUN and Creatinine are within    reference range. .    Sodium 12/07/2023 142  135 - 146 mmol/L Final   Potassium 12/07/2023 4.2  3.5 - 5.3 mmol/L Final   Chloride 12/07/2023 109  98 - 110 mmol/L Final   CO2 12/07/2023 25  20 - 32 mmol/L Final   Calcium 12/07/2023 8.8  8.6 - 10.3 mg/dL Final   Total Protein 91/77/7974 5.7 (L)  6.1 - 8.1 g/dL Final   Albumin 91/77/7974 4.0  3.6 - 5.1 g/dL Final   Globulin 91/77/7974 1.7 (L)  1.9 - 3.7 g/dL (calc) Final   AG Ratio 12/07/2023 2.4  1.0 - 2.5 (calc) Final   Total Bilirubin 12/07/2023 1.3 (H)  0.2 - 1.2 mg/dL Final   Alkaline phosphatase (APISO) 12/07/2023 41  35 - 144 U/L Final   AST 12/07/2023 18  10 - 35 U/L Final   ALT 12/07/2023 12  9 - 46 U/L Final   Cholesterol 12/07/2023 151  <200 mg/dL Final   HDL 91/77/7974 49  > OR = 40 mg/dL Final   Triglycerides 91/77/7974 49  <150 mg/dL Final   LDL Cholesterol (Calc) 12/07/2023 88  mg/dL (calc) Final   Comment: Reference range: <100 . Desirable range <100 mg/dL for primary prevention;   <70 mg/dL for patients with CHD or diabetic patients  with > or = 2 CHD risk factors. SABRA LDL-C is now calculated using the Martin-Hopkins  calculation, which is a  validated novel method providing  better accuracy than the Friedewald equation in the  estimation of LDL-C.  Gladis APPLETHWAITE et al. SANDREA. 7986;689(80): 2061-2068  (http://education.[facility].com/faq/FAQ164)    Total CHOL/HDL Ratio 12/07/2023 3.1  <4.9 (calc) Final   Non-HDL Cholesterol (Calc) 12/07/2023 102  <130 mg/dL (calc) Final   Comment: For patients with diabetes plus 1 major ASCVD risk  factor, treating to a non-HDL-C goal of <100 mg/dL  (LDL-C of <29 mg/dL) is considered a therapeutic  option.    PSA 12/07/2023 0.41  < OR = 4.00 ng/mL Final   Comment: The total PSA value from this assay system is  standardized against the WHO standard. The test  result will be approximately 20% lower when compared  to the equimolar-standardized total PSA (Beckman  Coulter). Comparison of serial PSA results should be  interpreted with this fact in mind. . This test was performed using the Siemens  chemiluminescent method. Values obtained from  different assay methods cannot be used interchangeably. PSA levels, regardless of value, should not be interpreted as absolute evidence of the presence or absence of disease.     Past Medical History:  Diagnosis Date   Allergy    Arthritis    hands   Cataract    removed bilat 2021   GERD (gastroesophageal reflux disease)    Past Surgical History:  Procedure Laterality Date   CATARACT EXTRACTION Bilateral 2021   CHOLECYSTECTOMY N/A 10/17/2016   Procedure: LAPAROSCOPIC CHOLECYSTECTOMY WITH  INTRAOPERATIVE CHOLANGIOGRAM;  Surgeon: Curvin Deward MOULD, MD;  Location: [facility];  Service: General;  Laterality: N/A;   COLONOSCOPY     NERVE TRANSFER     on left elbow (ulnaar neuropathy)   POLYPECTOMY     Current Outpatient Medications on File Prior to Visit  Medication Sig Dispense Refill   cyclobenzaprine  (FLEXERIL ) 10 MG tablet Take 1 tablet (10 mg total) by mouth 3 (three) times daily as needed for muscle spasms. 30 tablet 2   EPINEPHrine  0.3  mg/0.3 mL IJ SOAJ injection Inject 0.3 mg into the muscle as needed for anaphylaxis. 1 each 3   fexofenadine-pseudoephedrine (ALLEGRA-D) 60-120 MG 12 hr tablet Take 1 tablet by mouth 2 (two) times daily as needed (allergies).     No current facility-administered medications on file prior to visit.   Allergies  Allergen Reactions   Bee Venom Hives   Bextra [Valdecoxib] Other (See Comments)    Flu symptoms    Other Itching  Social History   Socioeconomic History   Marital status: Married    Spouse name: Not on file   Number of children: 2   Years of education: 16   Highest education level: Not on file  Occupational History   Occupation: Art gallery manager  Tobacco Use   Smoking status: Never   Smokeless tobacco: Never  Vaping Use   Vaping status: Never Used  Substance and Sexual Activity   Alcohol use: Yes    Alcohol/week: 0.0 standard drinks of alcohol    Comment: occasional   Drug use: No   Sexual activity: Yes  Other Topics Concern   Not on file  Social History Narrative   Fun: Travel, work on cars, go to [employer], and play golf.    Denies any religious beliefs that effect health care.    Social Drivers of Corporate investment banker Strain: Not on file  Food Insecurity: Not on file  Transportation Needs: Not on file  Physical Activity: Not on file  Stress: Not on file  Social Connections: Not on file  Intimate Partner Violence: Not on file   Family History  Problem Relation Age of Onset   Hypertension Mother    Ulcers Mother    Diabetes Father    Heart disease Father    Colon polyps Father    Diabetes Paternal Aunt    Diabetes Paternal Uncle    Cancer Maternal Grandmother    Diabetes Paternal Grandmother    Cancer Paternal Grandmother    Diabetes Paternal Grandfather    Cancer Maternal Grandfather    Colon cancer Maternal Grandfather    Esophageal cancer Neg Hx    Rectal cancer Neg Hx    Stomach cancer Neg Hx      Review of Systems  All other systems  reviewed and are negative.      Objective:   Physical Exam Vitals reviewed.  Constitutional:      General: He is not in acute distress.    Appearance: Normal appearance. He is well-developed and normal weight. He is not ill-appearing, toxic-appearing or diaphoretic.  HENT:     Head: Normocephalic and atraumatic.     Right Ear: Tympanic membrane, ear canal and external ear normal.     Left Ear: Tympanic membrane, ear canal and external ear normal.     Nose: No congestion or rhinorrhea.     Mouth/Throat:     Mouth: Mucous membranes are moist.     Pharynx: Oropharynx is clear. No oropharyngeal exudate or posterior oropharyngeal erythema.  Eyes:     General: No scleral icterus.       Right eye: No discharge.        Left eye: No discharge.     Conjunctiva/sclera: Conjunctivae normal.     Pupils: Pupils are equal, round, and reactive to light.  Neck:     Thyroid : No thyromegaly.     Vascular: No JVD.     Trachea: No tracheal deviation.  Cardiovascular:     Rate and Rhythm: Normal rate and regular rhythm.     Heart sounds: Normal heart sounds. No murmur heard.    No friction rub. No gallop.  Pulmonary:     Effort: Pulmonary effort is normal. No respiratory distress.     Breath sounds: Normal breath sounds. No stridor. No wheezing, rhonchi or rales.  Chest:     Chest wall: No tenderness.  Abdominal:     General: Bowel sounds are normal. There is no distension.  Palpations: Abdomen is soft. There is no mass.     Tenderness: There is no abdominal tenderness. There is no guarding or rebound.     Hernia: No hernia is present.  Musculoskeletal:        General: No tenderness or deformity. Normal range of motion.     Cervical back: Normal range of motion and neck supple.     Left lower leg: No edema.  Lymphadenopathy:     Cervical: No cervical adenopathy.  Skin:    General: Skin is warm.     Coloration: Skin is not pale.     Findings: No erythema or rash.  Neurological:      General: No focal deficit present.     Mental Status: He is alert and oriented to person, place, and time. Mental status is at baseline.     Cranial Nerves: No cranial nerve deficit.     Sensory: No sensory deficit.     Motor: No weakness or abnormal muscle tone.     Coordination: Coordination normal.     Gait: Gait normal.     Deep Tendon Reflexes: Reflexes normal.  Psychiatric:        Mood and Affect: Mood normal.        Behavior: Behavior normal.        Thought Content: Thought content normal.        Judgment: Judgment normal.           Assessment & Plan:  Routine general medical examination at a health care facility - Plan: CANCELED: CT CARDIAC SCORING (SELF PAY ONLY)  Pulmonary nodule - Plan: CT CHEST LCS NODULE F/U LOW DOSE WO CONTRAST, CANCELED: CT CHEST LCS NODULE F/U LOW DOSE WO CONTRAST  Lung nodule - Plan: CT CHEST LCS NODULE F/U LOW DOSE WO CONTRAST, CANCELED: CT CHEST LCS NODULE F/U LOW DOSE WO CONTRAST Patient's lab work is outstanding.  I recommended a flu shot, a COVID shot, and an RSV vaccine.  I will schedule the patient for a CT scan of his chest.  PSA and colonoscopy are up-to-date.  Regular anticipatory guidance was provided.  Patient denies any symptoms of angina or chest pain.  He denies any memory loss or depression.  Patient states that his back pain and neck pain have improved with physical therapy.

## 2023-12-11 ENCOUNTER — Encounter: Admitting: Rehabilitative and Restorative Service Providers"

## 2023-12-13 ENCOUNTER — Encounter: Admitting: Rehabilitative and Restorative Service Providers"

## 2024-01-09 ENCOUNTER — Ambulatory Visit (INDEPENDENT_AMBULATORY_CARE_PROVIDER_SITE_OTHER)

## 2024-01-09 VITALS — Ht 68.0 in | Wt 196.0 lb

## 2024-01-09 DIAGNOSIS — Z0000 Encounter for general adult medical examination without abnormal findings: Secondary | ICD-10-CM

## 2024-01-09 NOTE — Patient Instructions (Signed)
 Sergio Nelson,  Thank you for taking the time for your Medicare Wellness Visit. I appreciate your continued commitment to your health goals. Please review the care plan we discussed, and feel free to reach out if I can assist you further.  Medicare recommends these wellness visits once per year to help you and your care team stay ahead of potential health issues. These visits are designed to focus on prevention, allowing your provider to concentrate on managing your acute and chronic conditions during your regular appointments.  Please note that Annual Wellness Visits do not include a physical exam. Some assessments may be limited, especially if the visit was conducted virtually. If needed, we may recommend a separate in-person follow-up with your provider.  Ongoing Care Seeing your primary care provider every 3 to 6 months helps us  monitor your health and provide consistent, personalized care.   Referrals If a referral was made during today's visit and you haven't received any updates within two weeks, please contact the referred provider directly to check on the status.  Recommended Screenings:  Health Maintenance  Topic Date Due   COVID-19 Vaccine (5 - 2025-26 season) 12/17/2023   Flu Shot  07/15/2024*   DTaP/Tdap/Td vaccine (2 - Td or Tdap) 07/14/2024   Medicare Annual Wellness Visit  01/08/2025   Colon Cancer Screening  02/02/2026   Pneumococcal Vaccine for age over 49  Completed   Hepatitis C Screening  Completed   Zoster (Shingles) Vaccine  Completed   HPV Vaccine  Aged Out   Meningitis B Vaccine  Aged Out  *Topic was postponed. The date shown is not the original due date.       01/09/2024   11:31 AM  Advanced Directives  Does Patient Have a Medical Advance Directive? No  Would patient like information on creating a medical advance directive? Yes (MAU/Ambulatory/Procedural Areas - Information given)   Advance Care Planning is important because it: Ensures you receive medical  care that aligns with your values, goals, and preferences. Provides guidance to your family and loved ones, reducing the emotional burden of decision-making during critical moments.  Information on Advanced Care Planning can be found at [place]  Secretary of [facility] Advance Health Care Directives Advance Health Care Directives ([email or web address]   Vision: Annual vision screenings are recommended for early detection of glaucoma, cataracts, and diabetic retinopathy. These exams can also reveal signs of chronic conditions such as diabetes and high blood pressure.  Dental: Annual dental screenings help detect early signs of oral cancer, gum disease, and other conditions linked to overall health, including heart disease and diabetes.  Please see the attached documents for additional preventive care recommendations.

## 2024-01-09 NOTE — Progress Notes (Signed)
 Subjective:   Sergio Nelson is a 73 y.o. who presents for a Medicare Wellness preventive visit.  As a reminder, Annual Wellness Visits don't include a physical exam, and some assessments may be limited, especially if this visit is performed virtually. We may recommend an in-person follow-up visit with your provider if needed.  Visit Complete: Virtual I connected with  Sergio Nelson on 01/09/24 by a audio enabled telemedicine application and verified that I am speaking with the correct person using two identifiers.  Patient Location: Home  Provider Location: Home Office  I discussed the limitations of evaluation and management by telemedicine. The patient expressed understanding and agreed to proceed.  Vital Signs: Because this visit was a virtual/telehealth visit, some criteria may be missing or patient reported. Any vitals not documented were not able to be obtained and vitals that have been documented are patient reported.  VideoDeclined- This patient declined Librarian, academic. Therefore the visit was completed with audio only.  Persons Participating in Visit: Patient.  AWV Questionnaire: Yes: Patient Medicare AWV questionnaire was completed by the patient on 01/08/24; I have confirmed that all information answered by patient is correct and no changes since this date.  Cardiac Risk Factors include: advanced age (>26men, >30 women);male gender     Objective:    Today's Vitals   01/09/24 1129  Weight: 196 lb (88.9 kg)  Height: 5' 8 (1.727 m)   Body mass index is 29.8 kg/m.     01/09/2024   11:31 AM 08/30/2023   11:03 AM 10/21/2020    3:04 PM 10/17/2016    8:16 AM 10/15/2016    3:15 PM  Advanced Directives  Does Patient Have a Medical Advance Directive? No Yes Yes No  No   Type of Special educational needs teacher of [place];Living will Healthcare Power of [place];Living will    Does patient want to make changes to medical advance directive?  No  - Patient declined No - Patient declined    Copy of Healthcare Power of Attorney in Chart?   No - copy requested    Would patient like information on creating a medical advance directive? Yes (MAU/Ambulatory/Procedural Areas - Information given)   No - Patient declined  No - Patient declined      Data saved with a previous flowsheet row definition    Current Medications (verified) Outpatient Encounter Medications as of 01/09/2024  Medication Sig   cyclobenzaprine  (FLEXERIL ) 10 MG tablet Take 1 tablet (10 mg total) by mouth 3 (three) times daily as needed for muscle spasms.   EPINEPHrine  0.3 mg/0.3 mL IJ SOAJ injection Inject 0.3 mg into the muscle as needed for anaphylaxis.   fexofenadine-pseudoephedrine (ALLEGRA-D) 60-120 MG 12 hr tablet Take 1 tablet by mouth 2 (two) times daily as needed (allergies).   No facility-administered encounter medications on file as of 01/09/2024.    Allergies (verified) Bee venom, Bextra [valdecoxib], and Other   History: Past Medical History:  Diagnosis Date   Allergy    Arthritis    hands   Cataract    removed bilat 2021   GERD (gastroesophageal reflux disease)    Past Surgical History:  Procedure Laterality Date   CATARACT EXTRACTION Bilateral 2021   CHOLECYSTECTOMY N/A 10/17/2016   Procedure: LAPAROSCOPIC CHOLECYSTECTOMY WITH  INTRAOPERATIVE CHOLANGIOGRAM;  Surgeon: Curvin Deward MOULD, MD;  Location: [facility];  Service: General;  Laterality: N/A;   COLONOSCOPY     EYE SURGERY  2023   NERVE TRANSFER  on left elbow (ulnaar neuropathy)   POLYPECTOMY     Family History  Problem Relation Age of Onset   Hypertension Mother    Ulcers Mother    Arthritis Mother    Diabetes Father    Heart disease Father    Colon polyps Father    Hearing loss Father    Diabetes Paternal Aunt    Diabetes Paternal Uncle    Vision loss Paternal Uncle    Cancer Maternal Grandmother    Diabetes Paternal Grandmother    Cancer Paternal Grandmother    Diabetes  Paternal Grandfather    Cancer Paternal Grandfather    Cancer Maternal Grandfather    Colon cancer Maternal Grandfather    ADD / ADHD Son    Drug abuse Son    Depression Daughter    Miscarriages / Stillbirths Maternal Uncle    Esophageal cancer Neg Hx    Rectal cancer Neg Hx    Stomach cancer Neg Hx    Social History   Socioeconomic History   Marital status: Married    Spouse name: Not on file   Number of children: 2   Years of education: 16   Highest education level: Bachelor's degree (e.g., BA, AB, BS)  Occupational History   Occupation: Art gallery manager  Tobacco Use   Smoking status: Never   Smokeless tobacco: Never  Vaping Use   Vaping status: Never Used  Substance and Sexual Activity   Alcohol use: Yes    Comment: See above   Drug use: No   Sexual activity: Yes  Other Topics Concern   Not on file  Social History Narrative   Fun: Travel, work on cars, go to [employer], and play golf.    Denies any religious beliefs that effect health care.    Social Drivers of Corporate investment banker Strain: Low Risk  (01/08/2024)   Overall Financial Resource Strain (CARDIA)    Difficulty of Paying Living Expenses: Not hard at all  Food Insecurity: No Food Insecurity (01/08/2024)   Hunger Vital Sign    Worried About Running Out of Food in the Last Year: Never true    Ran Out of Food in the Last Year: Never true  Transportation Needs: No Transportation Needs (01/08/2024)   PRAPARE - Administrator, Civil Service (Medical): No    Lack of Transportation (Non-Medical): No  Physical Activity: Insufficiently Active (01/08/2024)   Exercise Vital Sign    Days of Exercise per Week: 3 days    Minutes of Exercise per Session: 30 min  Stress: No Stress Concern Present (01/08/2024)   [employer] of Occupational Health - Occupational Stress Questionnaire    Feeling of Stress: Not at all  Social Connections: Socially Integrated (01/08/2024)   Social Connection and Isolation  Panel    Frequency of Communication with Friends and Family: More than three times a week    Frequency of Social Gatherings with Friends and Family: Once a week    Attends Religious Services: 1 to 4 times per year    Active Member of [employer] or Organizations: Yes    Attends Banker Meetings: 1 to 4 times per year    Marital Status: Married    Tobacco Counseling Counseling given: Not Answered    Clinical Intake:  Pre-visit preparation completed: Yes  Pain : No/denies pain  Diabetes: No  Lab Results  Component Value Date   HGBA1C 4.9 10/13/2016   HGBA1C 5.0 07/15/2014   HGBA1C 4.8  02/09/2006     How often do you need to have someone help you when you read instructions, pamphlets, or other written materials from your doctor or pharmacy?: 1 - Never  Interpreter Needed?: No  Information entered by :: Charmaine Bloodgood LPN   Activities of Daily Living     01/09/2024   11:29 AM  In your present state of health, do you have any difficulty performing the following activities:  Hearing? 1  Vision? 0  Difficulty concentrating or making decisions? 0  Walking or climbing stairs? 0  Dressing or bathing? 0  Doing errands, shopping? 0  Preparing Food and eating ? N  Using the Toilet? N  In the past six months, have you accidently leaked urine? N  Do you have problems with loss of bowel control? N  Managing your Medications? N  Managing your Finances? N  Housekeeping or managing your Housekeeping? N    Patient Care Team: Duanne Butler DASEN, MD as PCP - General (Family Medicine) Charmayne Molly, MD as Consulting Physician (Ophthalmology) Karis Clunes, MD as Consulting Physician (Otolaryngology) Joshua Sieving, MD as Consulting Physician (Dermatology)  I have updated your Care Teams any recent Medical Services you may have received from other providers in the past year.     Assessment:   This is a routine wellness examination for Gershom.  Hearing/Vision screen Hearing  Screening - Comments:: Hearing difficulty; wears hearing aids and followed by ENT  Vision Screening - Comments:: Up to date with routine eye exams with Dr. Charmayne    Goals Addressed             This Visit's Progress    Maintain health and independence   On track      Depression Screen     01/09/2024   11:31 AM 12/10/2023    9:26 AM 03/29/2023    4:15 PM 12/07/2022    9:40 AM 01/11/2022   10:48 AM 12/05/2021   11:49 AM 12/05/2021   10:33 AM  PHQ 2/9 Scores  PHQ - 2 Score 0 0 0 0 0 0 0  PHQ- 9 Score   0  0      Fall Risk     01/09/2024   11:32 AM 12/10/2023    9:26 AM 03/29/2023    4:15 PM 12/07/2022    9:40 AM 01/11/2022   10:48 AM  Fall Risk   Falls in the past year? 0 0 1 0 0  Number falls in past yr: 0 0 0 0 0  Injury with Fall? 0 0 1 0 0  Risk for fall due to : No Fall Risks No Fall Risks  No Fall Risks   Follow up Falls prevention discussed;Education provided;Falls evaluation completed Falls evaluation completed  Falls prevention discussed     MEDICARE RISK AT HOME:  Medicare Risk at Home Any stairs in or around the home?: No If so, are there any without handrails?: No Home free of loose throw rugs in walkways, pet beds, electrical cords, etc?: Yes Adequate lighting in your home to reduce risk of falls?: Yes Life alert?: No Use of a cane, walker or w/c?: No Grab bars in the bathroom?: Yes Shower chair or bench in shower?: No Elevated toilet seat or a handicapped toilet?: Yes  TIMED UP AND GO:  Was the test performed?  No  Cognitive Function: 6CIT completed        01/09/2024   11:32 AM  6CIT Screen  What Year? 0 points  What  month? 0 points  What time? 0 points  Count back from 20 0 points  Months in reverse 0 points  Repeat phrase 0 points  Total Score 0 points    Immunizations Immunization History  Administered Date(s) Administered   INFLUENZA, HIGH DOSE SEASONAL PF 12/20/2017   Influenza-Unspecified 12/20/2017   PFIZER(Purple Top)SARS-COV-2  Vaccination 05/07/2019, 05/28/2019, 01/26/2020, 09/08/2020   Pneumococcal Conjugate-13 12/03/2017   Pneumococcal Polysaccharide-23 10/21/2020   Tdap 07/15/2014   Zoster Recombinant(Shingrix) 06/27/2018, 11/26/2018    Screening Tests Health Maintenance  Topic Date Due   COVID-19 Vaccine (5 - 2025-26 season) 12/17/2023   Influenza Vaccine  07/15/2024 (Originally 11/16/2023)   DTaP/Tdap/Td (2 - Td or Tdap) 07/14/2024   Medicare Annual Wellness (AWV)  01/08/2025   Colonoscopy  02/02/2026   Pneumococcal Vaccine: 50+ Years  Completed   Hepatitis C Screening  Completed   Zoster Vaccines- Shingrix  Completed   HPV VACCINES  Aged Out   Meningococcal B Vaccine  Aged Out    Health Maintenance Items Addressed: Information provided on vaccine recommendations   Additional Screening:  Vision Screening: Recommended annual ophthalmology exams for early detection of glaucoma and other disorders of the eye. Is the patient up to date with their annual eye exam?  Yes  Who is the provider or what is the name of the office in which the patient attends annual eye exams? Dr. Charmayne   Dental Screening: Recommended annual dental exams for proper oral hygiene  Community Resource Referral / Chronic Care Management: CRR required this visit?  No   CCM required this visit?  No   Plan:    I have personally reviewed and noted the following in the patient's chart:   Medical and social history Use of alcohol, tobacco or illicit drugs  Current medications and supplements including opioid prescriptions. Patient is not currently taking opioid prescriptions. Functional ability and status Nutritional status Physical activity Advanced directives List of other physicians Hospitalizations, surgeries, and ER visits in previous 12 months Vitals Screenings to include cognitive, depression, and falls Referrals and appointments  In addition, I have reviewed and discussed with patient certain preventive  protocols, quality metrics, and best practice recommendations. A written personalized care plan for preventive services as well as general preventive health recommendations were provided to patient.   Lavelle Pfeiffer [place], [place]   [id number]   After Visit Summary: (MyChart) Due to this being a telephonic visit, the after visit summary with patients personalized plan was offered to patient via MyChart   Notes: Nothing significant to report at this time.

## 2024-03-05 ENCOUNTER — Telehealth: Payer: Self-pay

## 2024-03-05 NOTE — Telephone Encounter (Signed)
 Copied from CRM #[id number]. Topic: Clinical - Request for Lab/Test Order >> Mar 05, 2024  1:54 PM Shanda MATSU wrote: Reason for CRM: Lynnea w/[facility] CB: [phone] opt 1, 4 called in stating imaging order for CT lung cancer screening nodule needs to be changed to CT chest without contrast.

## 2024-03-06 ENCOUNTER — Encounter: Payer: Self-pay | Admitting: Family Medicine

## 2024-03-06 ENCOUNTER — Other Ambulatory Visit: Payer: Self-pay | Admitting: Family Medicine

## 2024-03-06 DIAGNOSIS — R911 Solitary pulmonary nodule: Secondary | ICD-10-CM

## 2024-03-10 ENCOUNTER — Other Ambulatory Visit: Payer: Self-pay | Admitting: Family Medicine

## 2024-03-11 ENCOUNTER — Ambulatory Visit
Admission: RE | Admit: 2024-03-11 | Discharge: 2024-03-11 | Disposition: A | Discharge status: Home / Self Care | Source: Ambulatory Visit | Attending: Family Medicine | Admitting: Family Medicine

## 2024-03-11 DIAGNOSIS — R911 Solitary pulmonary nodule: Secondary | ICD-10-CM | POA: Diagnosis not present

## 2024-03-11 NOTE — Telephone Encounter (Signed)
 Requested medication (s) are due for refill today - yes  Requested medication (s) are on the active medication list -yes  Future visit scheduled -yes  Last refill: 12/11/23 #30 2RF  Notes to clinic: non delegated Rx  Requested Prescriptions  Pending Prescriptions Disp Refills   cyclobenzaprine  (FLEXERIL ) 10 MG tablet [Pharmacy Med Name: cyclobenzaprine  10 mg tablet] 30 tablet 2    Sig: TAKE ONE TABLET (10mg  total) THREE TIMES DAILY AS NEEDED muscle SPASMS     Not Delegated - Analgesics:  Muscle Relaxants Failed - 03/11/2024 11:47 AM      Failed - This refill cannot be delegated      Passed - Valid encounter within last 6 months    Recent Outpatient Visits           3 months ago Routine general medical examination at a health care facility   [facility] Duanne Butler DASEN, MD   7 months ago Mid back pain   [place] [facility] Duanne, Butler DASEN, MD   11 months ago Coccydynia   [place] [facility] Pickard, Butler DASEN, MD   1 year ago Routine general medical examination at a health care facility   [place] [facility] Duanne Butler DASEN, MD   2 years ago Bee sting reaction, accidental or unintentional, initial encounter   [place] [facility] Kayla Jeoffrey RAMAN, FNP                 Requested Prescriptions  Pending Prescriptions Disp Refills   cyclobenzaprine  (FLEXERIL ) 10 MG tablet [Pharmacy Med Name: cyclobenzaprine  10 mg tablet] 30 tablet 2    Sig: TAKE ONE TABLET (10mg  total) THREE TIMES DAILY AS NEEDED muscle SPASMS     Not Delegated - Analgesics:  Muscle Relaxants Failed - 03/11/2024 11:47 AM      Failed - This refill cannot be delegated      Passed - Valid encounter within last 6 months    Recent Outpatient Visits           3 months ago Routine general medical examination at a health care facility   [facility] Duanne Butler DASEN, MD   7 months ago Mid back pain   [place] [facility] Duanne, Butler DASEN, MD   11 months ago Coccydynia   [place] [facility] Duanne, Butler DASEN, MD   1 year ago Routine general medical examination at a health care facility    [place] [facility] Duanne Butler DASEN, MD   2 years ago Bee sting reaction, accidental or unintentional, initial encounter    [place] [facility] Kayla Jeoffrey RAMAN, FNP

## 2024-03-18 DIAGNOSIS — R911 Solitary pulmonary nodule: Secondary | ICD-10-CM | POA: Diagnosis not present

## 2024-03-20 ENCOUNTER — Ambulatory Visit: Payer: Self-pay | Admitting: Family Medicine

## 2024-05-22 ENCOUNTER — Other Ambulatory Visit: Payer: Self-pay | Admitting: Family Medicine

## 2024-12-09 ENCOUNTER — Other Ambulatory Visit

## 2024-12-11 ENCOUNTER — Encounter: Admitting: Family Medicine

## 2025-01-14 ENCOUNTER — Ambulatory Visit
